# Patient Record
Sex: Male | Born: 1965 | Race: Black or African American | Hispanic: No | Smoking: Current some day smoker
Health system: Southern US, Community
[De-identification: ages and names within clinical notes are randomized; demographics above are authoritative.]

## PROBLEM LIST (undated history)

## (undated) DIAGNOSIS — E8881 Metabolic syndrome: Secondary | ICD-10-CM

## (undated) DIAGNOSIS — R809 Proteinuria, unspecified: Secondary | ICD-10-CM

## (undated) DIAGNOSIS — G510 Bell's palsy: Secondary | ICD-10-CM

## (undated) DIAGNOSIS — Z9989 Dependence on other enabling machines and devices: Secondary | ICD-10-CM

## (undated) DIAGNOSIS — L309 Dermatitis, unspecified: Secondary | ICD-10-CM

## (undated) DIAGNOSIS — E785 Hyperlipidemia, unspecified: Secondary | ICD-10-CM

## (undated) DIAGNOSIS — I10 Essential (primary) hypertension: Secondary | ICD-10-CM

## (undated) DIAGNOSIS — M545 Low back pain, unspecified: Secondary | ICD-10-CM

## (undated) DIAGNOSIS — G4733 Obstructive sleep apnea (adult) (pediatric): Secondary | ICD-10-CM

## (undated) DIAGNOSIS — L739 Follicular disorder, unspecified: Secondary | ICD-10-CM

## (undated) DIAGNOSIS — R7989 Other specified abnormal findings of blood chemistry: Secondary | ICD-10-CM

## (undated) HISTORY — DX: Other specified abnormal findings of blood chemistry: R79.89

## (undated) HISTORY — DX: Follicular disorder, unspecified: L73.9

## (undated) HISTORY — DX: Morbid (severe) obesity due to excess calories: E66.01

## (undated) HISTORY — DX: Proteinuria, unspecified: R80.9

## (undated) HISTORY — DX: Metabolic syndrome: E88.810

## (undated) HISTORY — DX: Metabolic syndrome: E88.81

## (undated) HISTORY — DX: Hyperlipidemia, unspecified: E78.5

## (undated) HISTORY — DX: Bell's palsy: G51.0

## (undated) HISTORY — DX: Obstructive sleep apnea (adult) (pediatric): G47.33

## (undated) HISTORY — DX: Low back pain: M54.5

## (undated) HISTORY — PX: COLONOSCOPY: SHX174

## (undated) HISTORY — DX: Essential (primary) hypertension: I10

## (undated) HISTORY — DX: Dependence on other enabling machines and devices: Z99.89

## (undated) HISTORY — DX: Low back pain, unspecified: M54.50

---

## 2004-09-14 ENCOUNTER — Emergency Department: Payer: Self-pay | Admitting: Emergency Medicine

## 2008-12-14 ENCOUNTER — Ambulatory Visit: Payer: Self-pay | Admitting: Family Medicine

## 2009-01-02 LAB — HM COLONOSCOPY: HM Colonoscopy: NORMAL

## 2009-01-14 ENCOUNTER — Ambulatory Visit: Payer: Self-pay | Admitting: Family Medicine

## 2009-01-21 ENCOUNTER — Ambulatory Visit: Payer: Self-pay | Admitting: Gastroenterology

## 2009-09-06 ENCOUNTER — Ambulatory Visit: Payer: Self-pay | Admitting: Family Medicine

## 2010-10-06 IMAGING — CR DG CHEST 2V
1 series · 3 of 3 positions shown · non-contrast
Comparison: none

REASON FOR EXAM: cough 8550
COMMENTS:

PROCEDURE:     KDR - KDXR CHEST PA (OR AP) AND LAT  - September 06, 2009  [DATE]
RESULT:     The lung fields are clear. The heart, mediastinal and osseous
structures reveal no significant abnormalities.

[Series 1: view not recorded · 0.17mm/px · 3 of 3 slices shown]
[im 1/3]
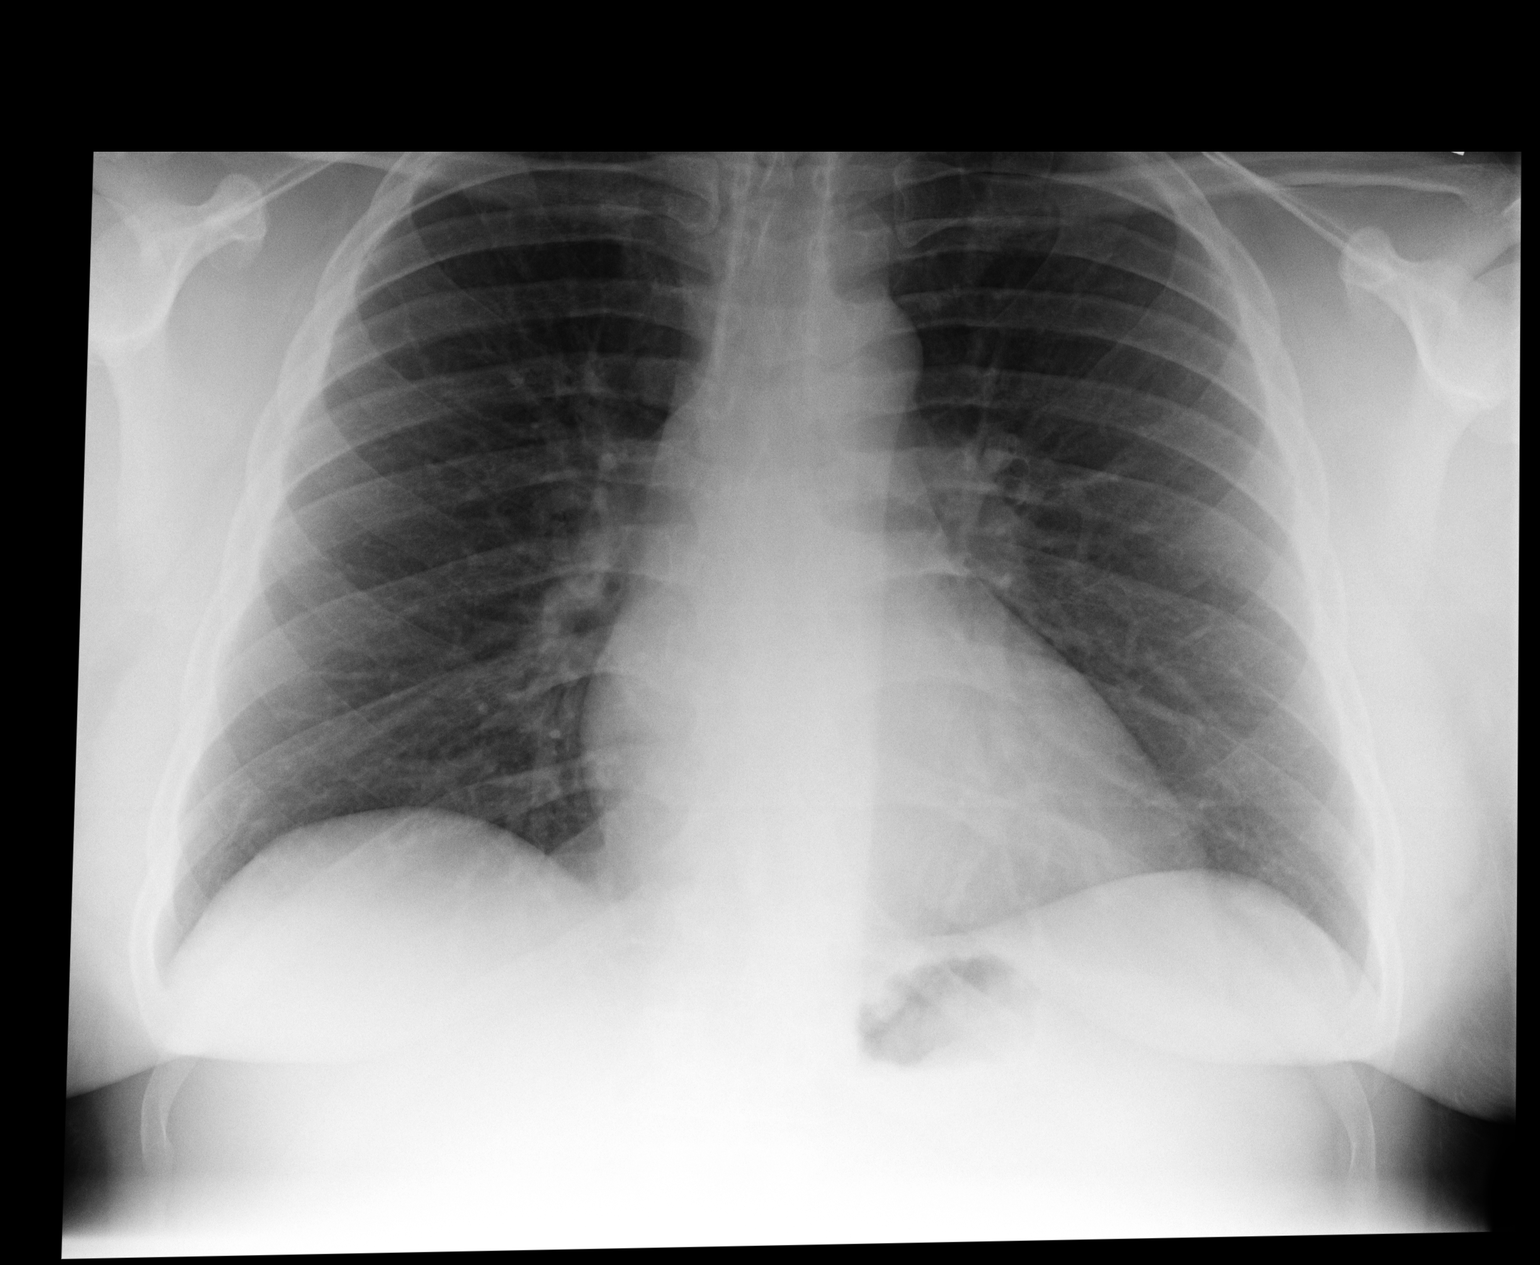
[im 2/3]
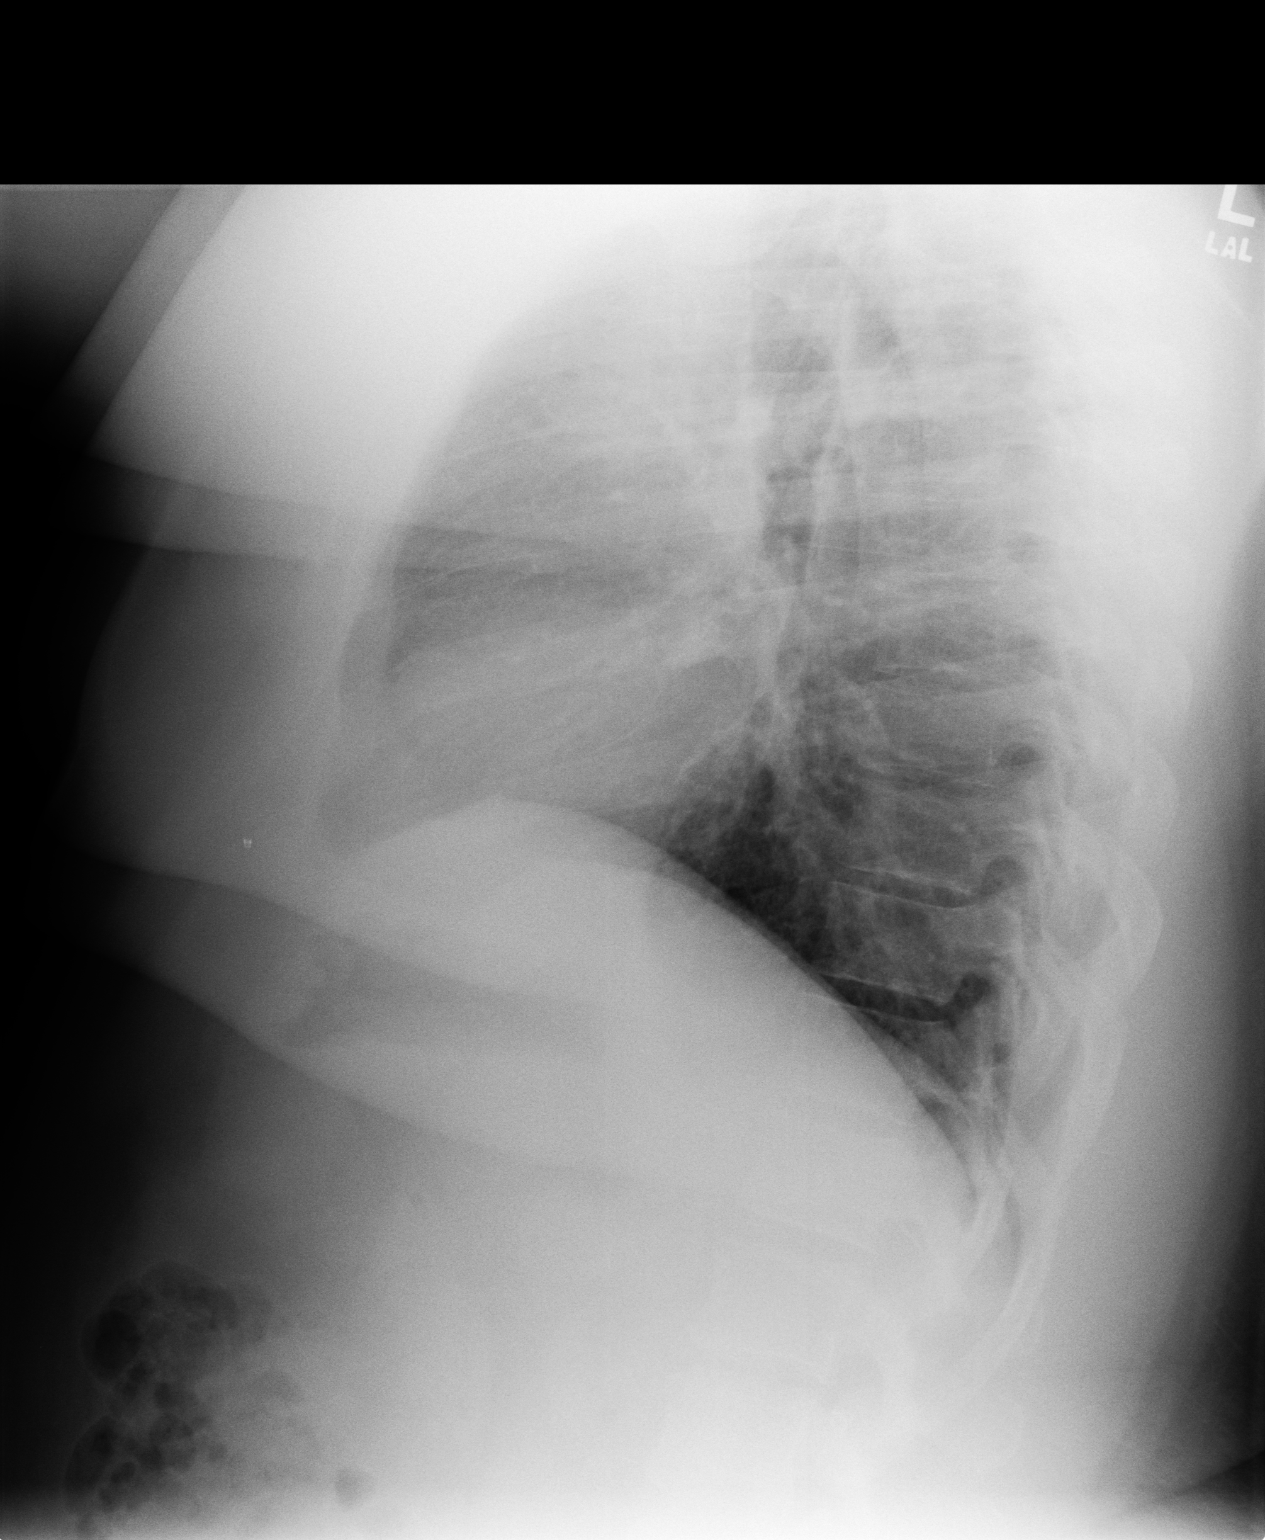
[im 3/3]
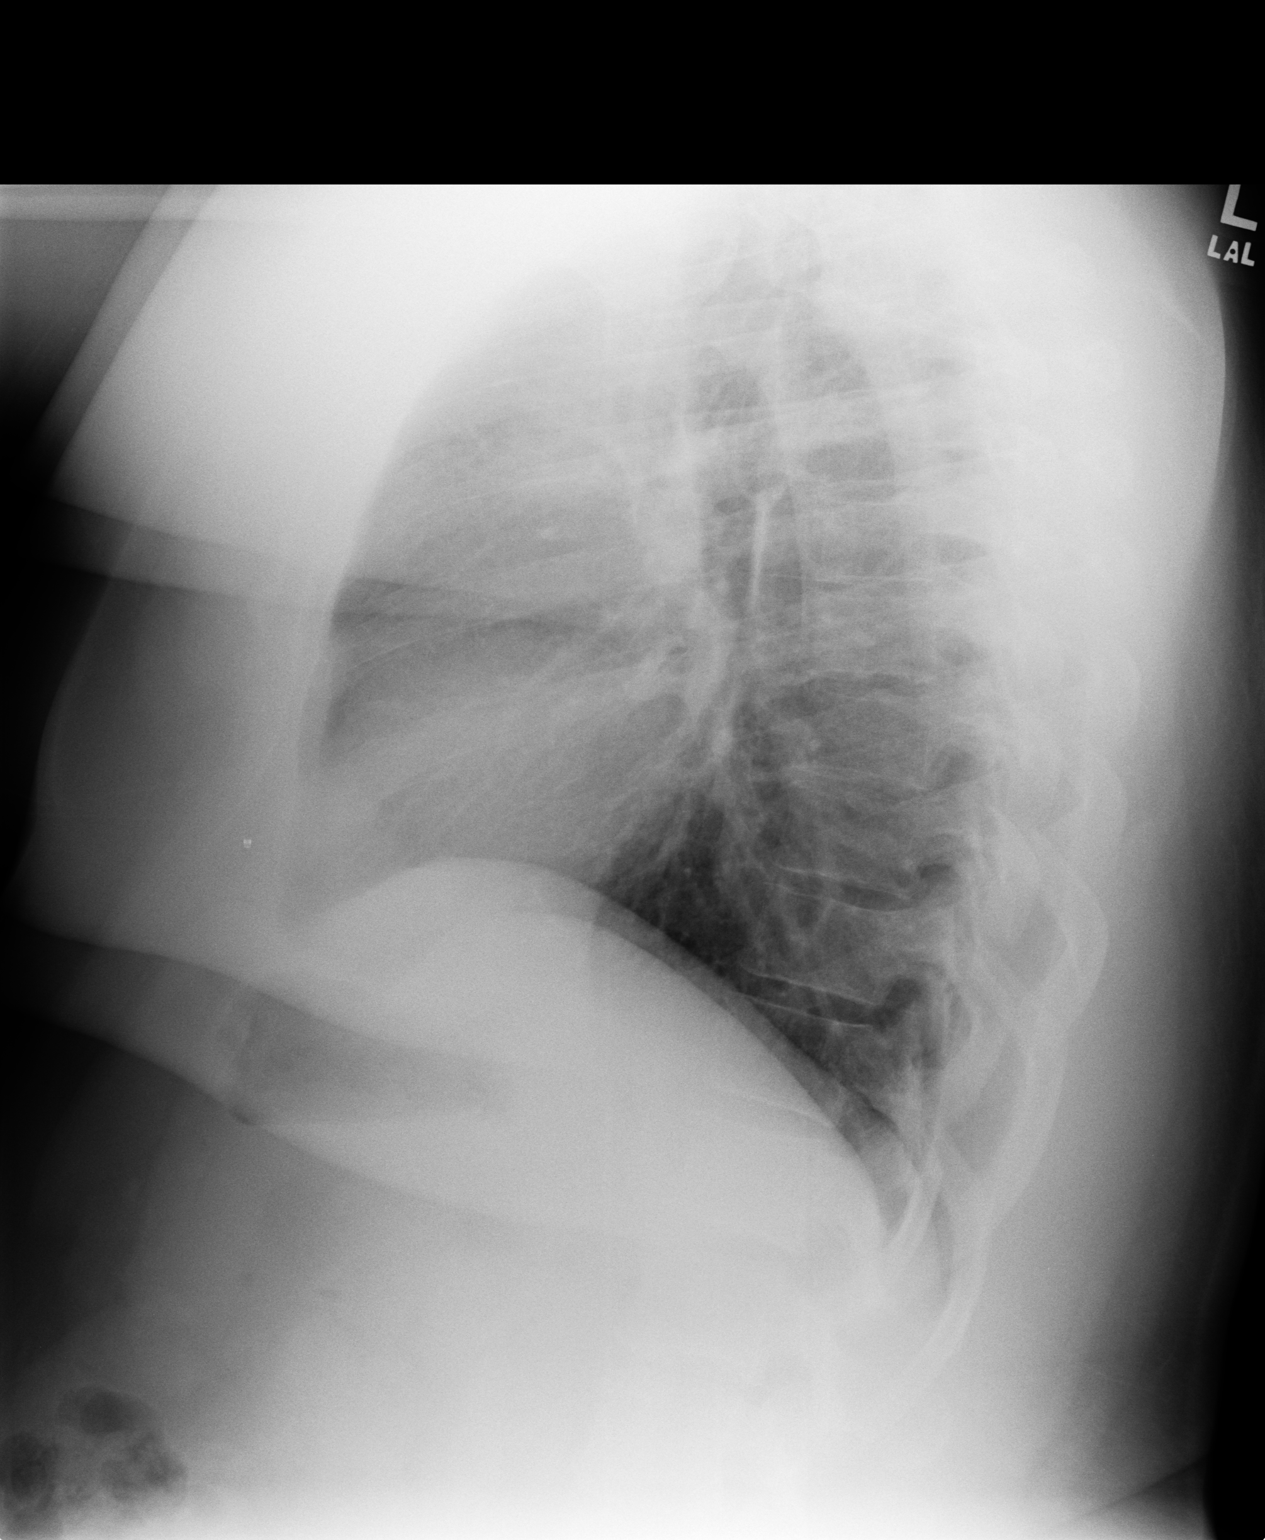

[3 of 3 positions shown; findings below may reference images not displayed]

IMPRESSION: No significant abnormalities are noted.

## 2013-10-02 LAB — LIPID PANEL
CHOLESTEROL: 200 mg/dL (ref 0–200)
HDL: 37 mg/dL (ref 35–70)
LDL Cholesterol: 134 mg/dL
Triglycerides: 143 mg/dL (ref 40–160)

## 2014-03-20 LAB — HEMOGLOBIN A1C: Hgb A1c MFr Bld: 5.7 % (ref 4.0–6.0)

## 2014-07-18 ENCOUNTER — Encounter: Payer: Self-pay | Admitting: Family Medicine

## 2014-07-18 DIAGNOSIS — Z8669 Personal history of other diseases of the nervous system and sense organs: Secondary | ICD-10-CM | POA: Insufficient documentation

## 2014-07-18 DIAGNOSIS — E559 Vitamin D deficiency, unspecified: Secondary | ICD-10-CM | POA: Insufficient documentation

## 2014-07-18 DIAGNOSIS — G4733 Obstructive sleep apnea (adult) (pediatric): Secondary | ICD-10-CM | POA: Insufficient documentation

## 2014-07-18 DIAGNOSIS — M545 Low back pain, unspecified: Secondary | ICD-10-CM | POA: Insufficient documentation

## 2014-07-18 DIAGNOSIS — R809 Proteinuria, unspecified: Secondary | ICD-10-CM | POA: Insufficient documentation

## 2014-07-18 DIAGNOSIS — E668 Other obesity: Secondary | ICD-10-CM | POA: Insufficient documentation

## 2014-07-18 DIAGNOSIS — E785 Hyperlipidemia, unspecified: Secondary | ICD-10-CM | POA: Insufficient documentation

## 2014-07-18 DIAGNOSIS — Z720 Tobacco use: Secondary | ICD-10-CM | POA: Insufficient documentation

## 2014-07-18 DIAGNOSIS — E8881 Metabolic syndrome: Secondary | ICD-10-CM | POA: Insufficient documentation

## 2014-07-18 DIAGNOSIS — I10 Essential (primary) hypertension: Secondary | ICD-10-CM | POA: Insufficient documentation

## 2014-07-23 ENCOUNTER — Ambulatory Visit: Payer: Self-pay | Admitting: Family Medicine

## 2014-10-04 ENCOUNTER — Encounter: Payer: Self-pay | Admitting: Family Medicine

## 2015-04-12 ENCOUNTER — Other Ambulatory Visit: Payer: Self-pay

## 2015-04-12 NOTE — Telephone Encounter (Signed)
Patient requesting refill. 

## 2015-04-13 MED ORDER — HYDROXYZINE HCL 25 MG PO TABS: 25.0000 mg | ORAL_TABLET | Freq: Every evening | ORAL | Status: DC | PRN

## 2015-04-23 ENCOUNTER — Encounter: Payer: Self-pay | Admitting: Family Medicine

## 2015-05-23 ENCOUNTER — Ambulatory Visit (INDEPENDENT_AMBULATORY_CARE_PROVIDER_SITE_OTHER): Payer: PRIVATE HEALTH INSURANCE | Admitting: Family Medicine

## 2015-05-23 ENCOUNTER — Encounter: Payer: Self-pay | Admitting: Family Medicine

## 2015-05-23 VITALS — BP 116/68 | HR 91 | Temp 98.6°F | Resp 16 | Ht 67.0 in | Wt 254.7 lb

## 2015-05-23 DIAGNOSIS — I10 Essential (primary) hypertension: Secondary | ICD-10-CM | POA: Diagnosis not present

## 2015-05-23 DIAGNOSIS — E8881 Metabolic syndrome: Secondary | ICD-10-CM | POA: Diagnosis not present

## 2015-05-23 DIAGNOSIS — G4733 Obstructive sleep apnea (adult) (pediatric): Secondary | ICD-10-CM

## 2015-05-23 DIAGNOSIS — R809 Proteinuria, unspecified: Secondary | ICD-10-CM

## 2015-05-23 DIAGNOSIS — Z125 Encounter for screening for malignant neoplasm of prostate: Secondary | ICD-10-CM

## 2015-05-23 DIAGNOSIS — Z114 Encounter for screening for human immunodeficiency virus [HIV]: Secondary | ICD-10-CM | POA: Diagnosis not present

## 2015-05-23 DIAGNOSIS — L739 Follicular disorder, unspecified: Secondary | ICD-10-CM

## 2015-05-23 DIAGNOSIS — E559 Vitamin D deficiency, unspecified: Secondary | ICD-10-CM | POA: Diagnosis not present

## 2015-05-23 DIAGNOSIS — E785 Hyperlipidemia, unspecified: Secondary | ICD-10-CM | POA: Diagnosis not present

## 2015-05-23 DIAGNOSIS — L200 Besnier's prurigo: Secondary | ICD-10-CM | POA: Diagnosis not present

## 2015-05-23 DIAGNOSIS — L239 Allergic contact dermatitis, unspecified cause: Secondary | ICD-10-CM | POA: Insufficient documentation

## 2015-05-23 DIAGNOSIS — Z0000 Encounter for general adult medical examination without abnormal findings: Secondary | ICD-10-CM | POA: Diagnosis not present

## 2015-05-23 MED ORDER — METFORMIN HCL 500 MG PO TABS: 500.0000 mg | ORAL_TABLET | Freq: Every day | ORAL | Status: DC

## 2015-05-23 MED ORDER — HYDROXYZINE HCL 50 MG PO TABS: 25.0000 mg | ORAL_TABLET | Freq: Every evening | ORAL | Status: DC | PRN

## 2015-05-23 MED ORDER — ATORVASTATIN CALCIUM 40 MG PO TABS: 40.0000 mg | ORAL_TABLET | Freq: Every day | ORAL | Status: DC

## 2015-05-23 MED ORDER — LISINOPRIL 20 MG PO TABS: 20.0000 mg | ORAL_TABLET | Freq: Every day | ORAL | Status: DC

## 2015-05-23 MED ORDER — MINOCYCLINE HCL 100 MG PO TABS: 100.0000 mg | ORAL_TABLET | Freq: Every day | ORAL | Status: DC

## 2015-05-23 MED ORDER — LORATADINE 10 MG PO TABS: 10.0000 mg | ORAL_TABLET | Freq: Every day | ORAL | Status: DC

## 2015-05-23 NOTE — Progress Notes (Signed)
Name: Isaac Delacruz   MRN: [id number]    DOB: 12-06-1965   Date:05/23/2015       Progress Note  Subjective  Chief Complaint  Chief Complaint  Patient presents with  . Annual Exam    HPI  Male Exam: he has nocturia , AUA filler out, mild symptoms. No ED  HTN: taking medication as prescribed, had a gap in insurance but has benefits again. No chest pain or palpitation  Metabolic Syndrome: he denies polydipsia, polyuria or polyphagia, due for labs. Taking Metformin.  Hyperlipidemia: out of medication for the past 2 months, we will recheck labs.   Tobacco use: he is not ready to quit smoking, denies daily cough, no SOB  Allergic Dermatitis: he scratches because he itches, he states it is a habit now, worse at night, not during work hours.    Patient Active Problem List   Diagnosis Date Noted  . Folliculitis 05/23/2015  . Allergic dermatitis 05/23/2015  . History of Bell's palsy 07/18/2014  . Benign essential HTN 07/18/2014  . Dyslipidemia 07/18/2014  . LBP (low back pain) 07/18/2014  . Vitamin D deficiency 07/18/2014  . Dysmetabolic syndrome 07/18/2014  . Microalbuminuria 07/18/2014  . Extreme obesity (HCC) 07/18/2014  . Obstructive apnea 07/18/2014  . Tobacco use 07/18/2014    History reviewed. No pertinent past surgical history.  Family History  Problem Relation Age of Onset  . Cancer Mother 36    Lung  . Cancer Father     Prostate  . Diabetes Father   . Hypertension Brother   . Cancer Maternal Aunt     Lung  . Heart disease Brother     Social History   Social History  . Marital Status: Married    Spouse Name: N/A  . Number of Children: N/A  . Years of Education: N/A   Occupational History  . Not on file.   Social History Main Topics  . Smoking status: Current Every Day Smoker -- 0.75 packs/day for 16 years    Types: Cigarettes  . Smokeless tobacco: Not on file  . Alcohol Use: 0.0 oz/week    0 Standard drinks or equivalent per week     Comment:  rarely  . Drug Use: No  . Sexual Activity: Yes   Other Topics Concern  . Not on file   Social History Narrative     Current outpatient prescriptions:  .  acetaminophen (TYLENOL 8 HOUR) 650 MG CR tablet, Take by mouth., Disp: , Rfl:  .  aspirin 81 MG tablet, Take by mouth., Disp: , Rfl:  .  atorvastatin (LIPITOR) 40 MG tablet, Take 1 tablet (40 mg total) by mouth daily at 6 PM., Disp: 90 tablet, Rfl: 1 .  Cholecalciferol (VITAMIN D HIGH POTENCY) 1000 units capsule, Take by mouth., Disp: , Rfl:  .  hydrOXYzine (ATARAX/VISTARIL) 50 MG tablet, Take 0.5 tablets (25 mg total) by mouth at bedtime as needed for itching., Disp: 90 tablet, Rfl: 1 .  lisinopril (PRINIVIL,ZESTRIL) 20 MG tablet, Take 1 tablet (20 mg total) by mouth daily., Disp: 90 tablet, Rfl: 1 .  metFORMIN (GLUCOPHAGE) 500 MG tablet, Take 1 tablet (500 mg total) by mouth daily with breakfast., Disp: 90 tablet, Rfl: 1 .  loratadine (CLARITIN) 10 MG tablet, Take 1 tablet (10 mg total) by mouth daily., Disp: 90 tablet, Rfl: 1 .  minocycline (DYNACIN) 100 MG tablet, Take 1 tablet (100 mg total) by mouth daily. Reported on 05/23/2015, Disp: 90 tablet, Rfl: 1  No  Known Allergies   ROS  Constitutional: Negative for fever or weight change.  Respiratory: Negative for cough and shortness of breath.   Cardiovascular: Negative for chest pain or palpitations.  Gastrointestinal: Negative for abdominal pain, no bowel changes.  Musculoskeletal: Negative for gait problem or joint swelling.  Skin: Positive  for rash.  Neurological: Negative for dizziness or headache.  No other specific complaints in a complete review of systems (except as listed in HPI above).  Objective  Filed Vitals:   05/23/15 1352  BP: 116/68  Pulse: 91  Temp: 98.6 F (37 C)  TempSrc: Oral  Resp: 16  Height: 5\' 7"  (1.702 m)  Weight: 254 lb 11.2 oz (115.531 kg)  SpO2: 97%    Body mass index is 39.88 kg/(m^2).  Physical Exam  Constitutional: Patient  appears well-developed and well-nourished, obese  No distress.  HENT: Head: Normocephalic and atraumatic. Ears: B TMs ok, no erythema or effusion; Nose: Nose normal. Mouth/Throat: Oropharynx is clear and moist. No oropharyngeal exudate.  Eyes: Conjunctivae and EOM are normal. Pupils are equal, round, and reactive to light. No scleral icterus.  Neck: Normal range of motion. Neck supple. No JVD present. No thyromegaly present.  Cardiovascular: Normal rate, regular rhythm and normal heart sounds.  No murmur heard. No BLE edema. Pulmonary/Chest: Effort normal and breath sounds normal. No respiratory distress. Abdominal: Soft. Bowel sounds are normal, no distension. There is no tenderness. no masses MALE GENITALIA: Normal descended testes bilaterally, no masses palpated, no hernias, no lesions, no discharge RECTAL: Prostate exam not done Musculoskeletal: Normal range of motion, no joint effusions. No gross deformities Neurological: he is alert and oriented to person, place, and time. No cranial nerve deficit. Coordination, balance, strength, speech and gait are normal.  Skin: Skin is warm and dry.  Acanthosis nigricans on neck, also has some folliculitis on neck- beard area has improved, also has excoriations on arms and upper back and legs from scratching - seen by Dermatologist in the past and advised to stop scratching.  Psychiatric: Patient has a normal mood and affect. behavior is normal. Judgment and thought content normal.  PHQ2/9: Depression screen PHQ 2/9 05/23/2015  Decreased Interest 0  Down, Depressed, Hopeless 0  PHQ - 2 Score 0    Fall Risk: Fall Risk  05/23/2015  Falls in the past year? No      Functional Status Survey: Is the patient deaf or have difficulty hearing?: No Does the patient have difficulty seeing, even when wearing glasses/contacts?: No Does the patient have difficulty concentrating, remembering, or making decisions?: No Does the patient have difficulty walking  or climbing stairs?: No Does the patient have difficulty dressing or bathing?: No Does the patient have difficulty doing errands alone such as visiting a doctor's office or shopping?: No  IPSS Questionnaire (AUA-7): Over the past month.   1)  How often have you had a sensation of not emptying your bladder completely after you finish urinating?  1 - Less than 1 time in 5  2)  How often have you had to urinate again less than two hours after you finished urinating? 0 - Not at all  3)  How often have you found you stopped and started again several times when you urinated?  1 - Less than 1 time in 5  4) How difficult have you found it to postpone urination?  0 - Not at all  5) How often have you had a weak urinary stream?  0 - Not at all  6) How often have you had to push or strain to begin urination?  0 - Not at all  7) How many times did you most typically get up to urinate from the time you went to bed until the time you got up in the morning?  2 - 2 times  Total score:  0-7 mildly symptomatic   8-19 moderately symptomatic   20-35 severely symptomatic      Assessment & Plan  1. Encounter for routine history and physical exam for male  Discussed importance of 150 minutes of physical activity weekly, eat two servings of fish weekly, eat one serving of tree nuts ( cashews, pistachios, pecans, almonds.Marland Kitchen) every other day, eat 6 servings of fruit/vegetables daily and drink plenty of water and avoid sweet beverages.    2. Benign essential HTN  - lisinopril (PRINIVIL,ZESTRIL) 20 MG tablet; Take 1 tablet (20 mg total) by mouth daily.  Dispense: 90 tablet; Refill: 1 - Comprehensive metabolic panel - CBC with Differential/Platelet  3. Dyslipidemia  - atorvastatin (LIPITOR) 40 MG tablet; Take 1 tablet (40 mg total) by mouth daily at 6 PM.  Dispense: 90 tablet; Refill: 1 - Lipid panel  4. Obstructive apnea  Continue CPAP every night  5. Morbid obesity, unspecified obesity type  [facility])  Discussed with the patient the risk posed by an increased BMI. Discussed importance of portion control, calorie counting and at least 150 minutes of physical activity weekly. Avoid sweet beverages and drink more water. Eat at least 6 servings of fruit and vegetables daily   6. Dysmetabolic syndrome  - metFORMIN (GLUCOPHAGE) 500 MG tablet; Take 1 tablet (500 mg total) by mouth daily with breakfast.  Dispense: 90 tablet; Refill: 1 - Hemoglobin A1c  7. Microalbuminuria  - POCT UA - Microalbumin  8. Vitamin D deficiency  - VITAMIN D 25 Hydroxy (Vit-D Deficiency, Fractures)  9. Folliculitis  - minocycline (DYNACIN) 100 MG tablet; Take 1 tablet (100 mg total) by mouth daily. Reported on 05/23/2015  Dispense: 90 tablet; Refill: 1  10. Allergic dermatitis  - hydrOXYzine (ATARAX/VISTARIL) 50 MG tablet; Take 0.5 tablets (25 mg total) by mouth at bedtime as needed for itching.  Dispense: 90 tablet; Refill: 1 - loratadine (CLARITIN) 10 MG tablet; Take 1 tablet (10 mg total) by mouth daily.  Dispense: 90 tablet; Refill: 1  11. Prostate cancer screening  - PSA  12. Encounter for screening for HIV  - HIV antibody

## 2015-05-24 LAB — CBC WITH DIFFERENTIAL/PLATELET
BASOS: 1 %
Basophils Absolute: 0.1 10*3/uL (ref 0.0–0.2)
EOS (ABSOLUTE): 0.3 10*3/uL (ref 0.0–0.4)
EOS: 6 %
HEMATOCRIT: 49.4 % (ref 37.5–51.0)
Hemoglobin: 16.5 g/dL (ref 12.6–17.7)
Immature Grans (Abs): 0 10*3/uL (ref 0.0–0.1)
Immature Granulocytes: 0 %
LYMPHS ABS: 1.9 10*3/uL (ref 0.7–3.1)
Lymphs: 34 %
MCH: 29.3 pg (ref 26.6–33.0)
MCHC: 33.4 g/dL (ref 31.5–35.7)
MCV: 88 fL (ref 79–97)
Monocytes Absolute: 1 10*3/uL — ABNORMAL HIGH (ref 0.1–0.9)
Monocytes: 17 %
Neutrophils Absolute: 2.4 10*3/uL (ref 1.4–7.0)
Neutrophils: 42 %
PLATELETS: 155 10*3/uL (ref 150–379)
RBC: 5.64 x10E6/uL (ref 4.14–5.80)
RDW: 14.9 % (ref 12.3–15.4)
WBC: 5.7 10*3/uL (ref 3.4–10.8)

## 2015-05-24 LAB — COMPREHENSIVE METABOLIC PANEL
A/G RATIO: 1.2 (ref 1.2–2.2)
ALT: 17 IU/L (ref 0–44)
AST: 23 IU/L (ref 0–40)
Albumin: 4.2 g/dL (ref 3.5–5.5)
Alkaline Phosphatase: 67 IU/L (ref 39–117)
BUN/Creatinine Ratio: 11 (ref 9–20)
BUN: 13 mg/dL (ref 6–24)
Bilirubin Total: 0.6 mg/dL (ref 0.0–1.2)
CALCIUM: 9.6 mg/dL (ref 8.7–10.2)
CHLORIDE: 101 mmol/L (ref 96–106)
CO2: 24 mmol/L (ref 18–29)
Creatinine, Ser: 1.15 mg/dL (ref 0.76–1.27)
GFR, EST AFRICAN AMERICAN: 87 mL/min/{1.73_m2} (ref >59)
GFR, EST NON AFRICAN AMERICAN: 75 mL/min/{1.73_m2} (ref >59)
Globulin, Total: 3.4 g/dL (ref 1.5–4.5)
Glucose: 92 mg/dL (ref 65–99)
POTASSIUM: 4.3 mmol/L (ref 3.5–5.2)
Sodium: 140 mmol/L (ref 134–144)
TOTAL PROTEIN: 7.6 g/dL (ref 6.0–8.5)

## 2015-05-24 LAB — HIV ANTIBODY (ROUTINE TESTING W REFLEX): HIV SCREEN 4TH GENERATION: NONREACTIVE

## 2015-05-24 LAB — PSA: Prostate Specific Ag, Serum: 0.5 ng/mL (ref 0.0–4.0)

## 2015-05-24 LAB — LIPID PANEL
CHOLESTEROL TOTAL: 194 mg/dL (ref 100–199)
Chol/HDL Ratio: 6.3 ratio units — ABNORMAL HIGH (ref 0.0–5.0)
HDL: 31 mg/dL — AB (ref >39)
LDL Calculated: 135 mg/dL — ABNORMAL HIGH (ref 0–99)
TRIGLYCERIDES: 140 mg/dL (ref 0–149)
VLDL Cholesterol Cal: 28 mg/dL (ref 5–40)

## 2015-05-24 LAB — HEMOGLOBIN A1C
ESTIMATED AVERAGE GLUCOSE: 123 mg/dL
Hgb A1c MFr Bld: 5.9 % — ABNORMAL HIGH (ref 4.8–5.6)

## 2015-05-24 LAB — VITAMIN D 25 HYDROXY (VIT D DEFICIENCY, FRACTURES): VIT D 25 HYDROXY: 10.1 ng/mL — AB (ref 30.0–100.0)

## 2015-05-26 ENCOUNTER — Other Ambulatory Visit: Payer: Self-pay | Admitting: Family Medicine

## 2015-05-26 MED ORDER — VITAMIN D (ERGOCALCIFEROL) 1.25 MG (50000 UNIT) PO CAPS: 50000.0000 [IU] | ORAL_CAPSULE | ORAL | Status: DC

## 2015-11-22 ENCOUNTER — Ambulatory Visit (INDEPENDENT_AMBULATORY_CARE_PROVIDER_SITE_OTHER): Payer: BLUE CROSS/BLUE SHIELD | Admitting: Family Medicine

## 2015-11-22 ENCOUNTER — Encounter: Payer: Self-pay | Admitting: Family Medicine

## 2015-11-22 VITALS — BP 122/64 | HR 73 | Temp 97.8°F | Resp 16 | Ht 67.0 in | Wt 253.4 lb

## 2015-11-22 DIAGNOSIS — E8881 Metabolic syndrome: Secondary | ICD-10-CM

## 2015-11-22 DIAGNOSIS — L239 Allergic contact dermatitis, unspecified cause: Secondary | ICD-10-CM | POA: Diagnosis not present

## 2015-11-22 DIAGNOSIS — I10 Essential (primary) hypertension: Secondary | ICD-10-CM

## 2015-11-22 DIAGNOSIS — L739 Follicular disorder, unspecified: Secondary | ICD-10-CM | POA: Diagnosis not present

## 2015-11-22 DIAGNOSIS — R809 Proteinuria, unspecified: Secondary | ICD-10-CM

## 2015-11-22 DIAGNOSIS — Z23 Encounter for immunization: Secondary | ICD-10-CM | POA: Diagnosis not present

## 2015-11-22 DIAGNOSIS — E785 Hyperlipidemia, unspecified: Secondary | ICD-10-CM | POA: Diagnosis not present

## 2015-11-22 DIAGNOSIS — G4733 Obstructive sleep apnea (adult) (pediatric): Secondary | ICD-10-CM

## 2015-11-22 LAB — POCT UA - MICROALBUMIN: MICROALBUMIN (UR) POC: 20 mg/L

## 2015-11-22 MED ORDER — MINOCYCLINE HCL 100 MG PO TABS: 100.0000 mg | ORAL_TABLET | Freq: Every day | ORAL | 1 refills | Status: DC

## 2015-11-22 MED ORDER — ATORVASTATIN CALCIUM 40 MG PO TABS: 40.0000 mg | ORAL_TABLET | Freq: Every day | ORAL | 1 refills | Status: DC

## 2015-11-22 MED ORDER — METFORMIN HCL 500 MG PO TABS: 500.0000 mg | ORAL_TABLET | Freq: Every day | ORAL | 1 refills | Status: DC

## 2015-11-22 MED ORDER — LISINOPRIL 20 MG PO TABS: 20.0000 mg | ORAL_TABLET | Freq: Every day | ORAL | 1 refills | Status: DC

## 2015-11-22 MED ORDER — HYDROXYZINE HCL 50 MG PO TABS: 50.0000 mg | ORAL_TABLET | Freq: Every evening | ORAL | 1 refills | Status: DC | PRN

## 2015-11-22 NOTE — Progress Notes (Signed)
Name: Isaac Delacruz   MRN: [id number]    DOB: 1965/05/20   Date:11/22/2015       Progress Note  Subjective  Chief Complaint  Chief Complaint  Patient presents with  . Flu Vaccine  . Hyperlipidemia    6 month follow up  . Hypertension  . Obesity  . Sleep Apnea    HPI  HTN: taking medication as prescribed. No chest pain, SOB or palpitation. He checks bp at home and it is usually 120's-140's/60's-70's  Metabolic Syndrome: he denies polydipsia, polyuria or polyphagia, last hgbA1C was 5.9%. Taking Metformin and denies side effects.  Hyperlipidemia: he was out of medication 2 months prior to last labs. Discussed ways to increase good cholesterol  ( it was 31)  Tobacco use: he is not ready to quit smoking, denies daily cough, no SOB, there is a family history of lung cancer, and reminded him the importance of quitting smoking.   Allergic Dermatitis: he scratches because he itches, he states it is a habit now, worse at night, not during work hours. Doing well with Atarax at night  OSA: he wears CPAP every night, very seldom wakes up with a headache, he denies day time fatigue  Obesity: weight has been stable, he has noticed decreased libido, no joint pains. Explained that losing weight can help with that, also discussed risk of testosterone supplementation.     Patient Active Problem List   Diagnosis Date Noted  . Folliculitis 05/23/2015  . Allergic dermatitis 05/23/2015  . History of Bell's palsy 07/18/2014  . Benign essential HTN 07/18/2014  . Dyslipidemia 07/18/2014  . LBP (low back pain) 07/18/2014  . Vitamin D deficiency 07/18/2014  . Dysmetabolic syndrome 07/18/2014  . Microalbuminuria 07/18/2014  . Extreme obesity (HCC) 07/18/2014  . Obstructive apnea 07/18/2014  . Tobacco use 07/18/2014    History reviewed. No pertinent surgical history.  Family History  Problem Relation Age of Onset  . Cancer Mother 44    Lung  . Cancer Father     Prostate  . Diabetes  Father   . Hypertension Brother   . Cancer Maternal Aunt     Lung  . Heart disease Brother     Social History   Social History  . Marital status: Married    Spouse name: N/A  . Number of children: N/A  . Years of education: N/A   Occupational History  . Not on file.   Social History Main Topics  . Smoking status: Current Every Day Smoker    Packs/day: 0.75    Years: 18.00    Types: Cigarettes  . Smokeless tobacco: Never Used  . Alcohol use 0.0 oz/week     Comment: rarely  . Drug use: No  . Sexual activity: Yes   Other Topics Concern  . Not on file   Social History Narrative  . No narrative on file     Current Outpatient Prescriptions:  .  acetaminophen (TYLENOL 8 HOUR) 650 MG CR tablet, Take by mouth., Disp: , Rfl:  .  aspirin 81 MG tablet, Take by mouth., Disp: , Rfl:  .  atorvastatin (LIPITOR) 40 MG tablet, Take 1 tablet (40 mg total) by mouth daily at 6 PM., Disp: 90 tablet, Rfl: 1 .  Cholecalciferol (VITAMIN D HIGH POTENCY) 1000 units capsule, Take by mouth., Disp: , Rfl:  .  hydrOXYzine (ATARAX/VISTARIL) 50 MG tablet, Take 1 tablet (50 mg total) by mouth at bedtime as needed for itching., Disp: 90 tablet, Rfl: 1 .  lisinopril (PRINIVIL,ZESTRIL) 20 MG tablet, Take 1 tablet (20 mg total) by mouth daily., Disp: 90 tablet, Rfl: 1 .  loratadine (CLARITIN) 10 MG tablet, Take 1 tablet (10 mg total) by mouth daily., Disp: 90 tablet, Rfl: 1 .  metFORMIN (GLUCOPHAGE) 500 MG tablet, Take 1 tablet (500 mg total) by mouth daily with breakfast., Disp: 90 tablet, Rfl: 1 .  minocycline (DYNACIN) 100 MG tablet, Take 1 tablet (100 mg total) by mouth daily. Reported on 05/23/2015, Disp: 90 tablet, Rfl: 1 .  Vitamin D, Ergocalciferol, (DRISDOL) 50000 units CAPS capsule, Take 1 capsule (50,000 Units total) by mouth every 7 (seven) days., Disp: 12 capsule, Rfl: 0  No Known Allergies   ROS  Constitutional: Negative for fever or weight change.  Respiratory: Negative for cough and  shortness of breath.   Cardiovascular: Negative for chest pain or palpitations.  Gastrointestinal: Negative for abdominal pain, no bowel changes.  Musculoskeletal: Negative for gait problem or joint swelling.  Skin: Positive for rash - folliculitis - doing well on medication .  Neurological: Negative for dizziness or headache.  No other specific complaints in a complete review of systems (except as listed in HPI above).  Objective  Vitals:   11/22/15 0844  BP: 122/64  Pulse: 73  Resp: 16  Temp: 97.8 F (36.6 C)  TempSrc: Oral  SpO2: 98%  Weight: 253 lb 7 oz (115 kg)  Height: 5\' 7"  (1.702 m)    Body mass index is 39.69 kg/m.  Physical Exam  Constitutional: Patient appears well-developed and well-nourished. Obese No distress.  HEENT: head atraumatic, normocephalic, pupils equal and reactive to light,  neck supple, throat within normal limits Cardiovascular: Normal rate, regular rhythm and normal heart sounds.  No murmur heard. No BLE edema. Pulmonary/Chest: Effort normal and breath sounds normal. No respiratory distress. Abdominal: Soft.  There is no tenderness. Skin: neck resolved, some irritation/ from picking on hair follicle on beard area, but greatly improved  Psychiatric: Patient has a normal mood and affect. behavior is normal. Judgment and thought content normal.  PHQ2/9: Depression screen [facility] 2/9 11/22/2015 05/23/2015  Decreased Interest 0 0  Down, Depressed, Hopeless 0 0  PHQ - 2 Score 0 0     Fall Risk: Fall Risk  11/22/2015 05/23/2015  Falls in the past year? No No      Functional Status Survey: Is the patient deaf or have difficulty hearing?: No Does the patient have difficulty seeing, even when wearing glasses/contacts?: No Does the patient have difficulty concentrating, remembering, or making decisions?: No Does the patient have difficulty walking or climbing stairs?: No Does the patient have difficulty dressing or bathing?: No Does the patient have  difficulty doing errands alone such as visiting a doctor's office or shopping?: No    Assessment & Plan  1. Benign essential HTN  - lisinopril (PRINIVIL,ZESTRIL) 20 MG tablet; Take 1 tablet (20 mg total) by mouth daily.  Dispense: 90 tablet; Refill: 1 -EKG  2. Need for influenza vaccination  - Flu Vaccine QUAD 36+ mos PF IM (Fluarix & Fluzone Quad PF)  3. Dyslipidemia  - atorvastatin (LIPITOR) 40 MG tablet; Take 1 tablet (40 mg total) by mouth daily at 6 PM.  Dispense: 90 tablet; Refill: 1  4. Dysmetabolic syndrome  - metFORMIN (GLUCOPHAGE) 500 MG tablet; Take 1 tablet (500 mg total) by mouth daily with breakfast.  Dispense: 90 tablet; Refill: 1  5. Obstructive apnea  Continue CPAP every night -EKG  6. Morbid obesity, unspecified obesity type (  HCC)  Discussed with the patient the risk posed by an increased BMI. Discussed importance of portion control, calorie counting and at least 150 minutes of physical activity weekly. Avoid sweet beverages and drink more water. Eat at least 6 servings of fruit and vegetables daily   7. Microalbuminuria  -urine micro   8. Folliculitis  - minocycline (DYNACIN) 100 MG tablet; Take 1 tablet (100 mg total) by mouth daily. Reported on 05/23/2015  Dispense: 90 tablet; Refill: 1  9. Allergic dermatitis  - hydrOXYzine (ATARAX/VISTARIL) 50 MG tablet; Take 1 tablet (50 mg total) by mouth at bedtime as needed for itching.  Dispense: 90 tablet; Refill: 1

## 2016-05-25 ENCOUNTER — Encounter: Payer: BLUE CROSS/BLUE SHIELD | Admitting: Family Medicine

## 2016-05-27 ENCOUNTER — Other Ambulatory Visit: Payer: Self-pay | Admitting: Family Medicine

## 2016-05-27 DIAGNOSIS — L239 Allergic contact dermatitis, unspecified cause: Secondary | ICD-10-CM

## 2016-05-27 NOTE — Telephone Encounter (Signed)
Patient requesting refill of Loratadine to [facility].

## 2016-06-12 ENCOUNTER — Encounter: Payer: Self-pay | Admitting: Family Medicine

## 2016-06-12 ENCOUNTER — Ambulatory Visit (INDEPENDENT_AMBULATORY_CARE_PROVIDER_SITE_OTHER): Payer: BLUE CROSS/BLUE SHIELD | Admitting: Family Medicine

## 2016-06-12 VITALS — BP 122/62 | HR 73 | Temp 98.0°F | Resp 16 | Ht 67.0 in | Wt 245.2 lb

## 2016-06-12 DIAGNOSIS — J302 Other seasonal allergic rhinitis: Secondary | ICD-10-CM

## 2016-06-12 DIAGNOSIS — H6983 Other specified disorders of Eustachian tube, bilateral: Secondary | ICD-10-CM

## 2016-06-12 DIAGNOSIS — H6993 Unspecified Eustachian tube disorder, bilateral: Secondary | ICD-10-CM

## 2016-06-12 MED ORDER — FLUTICASONE PROPIONATE 50 MCG/ACT NA SUSP: 2.0000 | Freq: Every day | NASAL | 1 refills | Status: DC

## 2016-06-12 NOTE — Progress Notes (Signed)
Name: Isaac Delacruz   MRN: [id number]    DOB: [birth year]   Date:06/12/2016       Progress Note  Subjective  Chief Complaint  Chief Complaint  Patient presents with  . Otalgia    rt ear for 3 days    HPI  Pt presents with 3-4 day history of right ear throbbing pain at 6//10 pain, he also notes ongoing allergic rhinitis during the spring season. He denies sinus pain, CP, shortness of breath, or sore throat.  No NVD. Pt is current every day smoker - smokes ~5 cigarettes a day.  Patient Active Problem List   Diagnosis Date Noted  . Folliculitis 05/23/2015  . Allergic dermatitis 05/23/2015  . History of Bell's palsy 07/18/2014  . Benign essential HTN 07/18/2014  . Dyslipidemia 07/18/2014  . LBP (low back pain) 07/18/2014  . Vitamin D deficiency 07/18/2014  . Dysmetabolic syndrome 07/18/2014  . Microalbuminuria 07/18/2014  . Extreme obesity 07/18/2014  . Obstructive apnea 07/18/2014  . Tobacco use 07/18/2014    Social History  Substance Use Topics  . Smoking status: Current Every Day Smoker    Packs/day: 0.75    Years: 18.00    Types: Cigarettes  . Smokeless tobacco: Never Used  . Alcohol use 0.0 oz/week     Comment: rarely     Current Outpatient Prescriptions:  .  acetaminophen (TYLENOL 8 HOUR) 650 MG CR tablet, Take by mouth., Disp: , Rfl:  .  aspirin 81 MG tablet, Take by mouth., Disp: , Rfl:  .  atorvastatin (LIPITOR) 40 MG tablet, Take 1 tablet (40 mg total) by mouth daily at 6 PM., Disp: 90 tablet, Rfl: 1 .  Cholecalciferol (VITAMIN D HIGH POTENCY) 1000 units capsule, Take by mouth., Disp: , Rfl:  .  fluticasone (FLONASE) 50 MCG/ACT nasal spray, Place 2 sprays into both nostrils daily., Disp: 16 g, Rfl: 1 .  hydrOXYzine (ATARAX/VISTARIL) 50 MG tablet, Take 1 tablet (50 mg total) by mouth at bedtime as needed for itching., Disp: 90 tablet, Rfl: 1 .  lisinopril (PRINIVIL,ZESTRIL) 20 MG tablet, Take 1 tablet (20 mg total) by mouth daily., Disp: 90 tablet, Rfl: 1 .   loratadine (CLARITIN) 10 MG tablet, TAKE ONE TABLET BY MOUTH EVERY DAY, Disp: 90 tablet, Rfl: 1 .  metFORMIN (GLUCOPHAGE) 500 MG tablet, Take 1 tablet (500 mg total) by mouth daily with breakfast., Disp: 90 tablet, Rfl: 1 .  minocycline (DYNACIN) 100 MG tablet, Take 1 tablet (100 mg total) by mouth daily. Reported on 05/23/2015, Disp: 90 tablet, Rfl: 1 .  Vitamin D, Ergocalciferol, (DRISDOL) 50000 units CAPS capsule, Take 1 capsule (50,000 Units total) by mouth every 7 (seven) days., Disp: 12 capsule, Rfl: 0  No Known Allergies  ROS  Constitutional: Negative for fever or weight change.  HEENT: See HPI Respiratory: Negative for cough and shortness of breath.   Cardiovascular: Negative for chest pain or palpitations.  Gastrointestinal: Negative for abdominal pain, no bowel changes.  Musculoskeletal: Negative for gait problem or joint swelling.  Skin: Negative for rash.  Neurological: Negative for dizziness or headache.  No other specific complaints in a complete review of systems (except as listed in HPI above).  Objective  Vitals:   06/12/16 1431 06/12/16 1518  BP: 122/62   Pulse: (!) 109 73  Resp: 16   Temp: 98 F (36.7 C)   SpO2: 98%   Weight: 245 lb 3 oz (111.2 kg)   Height: 5\' 7"  (1.702 m)  Body mass index is 38.4 kg/m.  Nursing Note and Vital Signs reviewed.  Physical Exam  Constitutional: Patient appears well-developed and well-nourished. Obese No distress.  HEENT: head atraumatic, normocephalic, pupils equal and reactive to light, conjunctiva with mild redness bilaterally, EOM's intact, Right TM retracted, left TM with mild bulging, no maxillary or frontal sinus pain on palpation, neck supple without lymphadenopathy, oropharynx pink and moist without exudate. Nares normal, turbinates hypertrophied bilaterally Cardiovascular: Normal rate, regular rhythm, S1/S2 present.  No murmur or rub heard. No BLE edema. Pulmonary/Chest: Effort normal and breath sounds clear. No  respiratory distress or retractions. Psychiatric: Patient has a normal mood and affect. behavior is normal. Judgment and thought content normal.  No results found for this or any previous visit (from the past 2160 hour(s)).  Assessment & Plan  1. Seasonal allergic rhinitis, unspecified trigger -Will purchase OTC antihistamine per AVS.  - fluticasone (FLONASE) 50 MCG/ACT nasal spray; Place 2 sprays into both nostrils daily.  Dispense: 16 g; Refill: 1  2. Eustachian tube dysfunction, bilateral  - fluticasone (FLONASE) 50 MCG/ACT nasal spray; Place 2 sprays into both nostrils daily.  Dispense: 16 g; Refill: 1 -RTC in 2-3 weeks if not improving, sooner if red flags occur as discussed below.   -Red flags and when to present for emergency care or RTC including fever >101.56F, chest pain, shortness of breath, new/worsening/un-resolving symptoms or pain reviewed with patient at time of visit. Follow up and care instructions discussed and provided in AVS. -Reviewed Health Maintenance: Has physical scheduled for August.  I saw the patient with Maurice SmallEmily Boyce, FNP, NP-C.   Alba CoryKrichna Sowles, MD [facility] 06/14/2016, 9:43 PM

## 2016-06-12 NOTE — Patient Instructions (Addendum)
Please buy either Cetirizine (Zyrtec), Xyzal, Loratidine (Claritin), or Fexofenadine (Allegra) and take daily. Allergies An allergy is when your body reacts to a substance in a way that is not normal. An allergic reaction can happen after you:  Eat something.  Breathe in something.  Touch something. You can be allergic to:  Things that are only around during certain seasons, like molds and pollens.  Foods.  Drugs.  Insects.  Animal dander. What are the signs or symptoms?  Puffiness (swelling). This may happen on the lips, face, tongue, mouth, or throat.  Sneezing.  Coughing.  Breathing loudly (wheezing).  Stuffy nose.  Tingling in the mouth.  A rash.  Itching.  Itchy, red, puffy areas of skin (hives).  Watery eyes.  Throwing up (vomiting).  Watery poop (diarrhea).  Dizziness.  Feeling faint or fainting.  Trouble breathing or swallowing.  A tight feeling in the chest.  A fast heartbeat. How is this diagnosed? Allergies can be diagnosed with:  A medical and family history.  Skin tests.  Blood tests.  A food diary. A food diary is a record of all the foods, drinks, and symptoms you have each day.  The results of an elimination diet. This diet involves making sure not to eat certain foods and then seeing what happens when you start eating them again. How is this treated? There is no cure for allergies, but allergic reactions can be treated with medicine. Severe reactions usually need to be treated at a hospital. How is this prevented? The best way to prevent an allergic reaction is to avoid the thing you are allergic to. Allergy shots and medicines can also help prevent reactions in some cases. This information is not intended to replace advice given to you by your health care provider. Make sure you discuss any questions you have with your health care provider. Document Released: 05/16/2012 Document Revised: 09/16/2015 Document Reviewed:  10/31/2013 Elsevier Interactive Patient Education  2017 [employer]

## 2016-08-18 ENCOUNTER — Other Ambulatory Visit: Payer: Self-pay | Admitting: Family Medicine

## 2016-08-18 DIAGNOSIS — H6983 Other specified disorders of Eustachian tube, bilateral: Secondary | ICD-10-CM

## 2016-08-18 DIAGNOSIS — J302 Other seasonal allergic rhinitis: Secondary | ICD-10-CM | POA: Insufficient documentation

## 2016-09-09 ENCOUNTER — Ambulatory Visit (INDEPENDENT_AMBULATORY_CARE_PROVIDER_SITE_OTHER): Payer: BLUE CROSS/BLUE SHIELD | Admitting: Family Medicine

## 2016-09-09 ENCOUNTER — Encounter: Payer: Self-pay | Admitting: Family Medicine

## 2016-09-09 VITALS — BP 120/68 | HR 98 | Temp 98.5°F | Resp 16 | Ht 67.8 in | Wt 230.6 lb

## 2016-09-09 DIAGNOSIS — G4733 Obstructive sleep apnea (adult) (pediatric): Secondary | ICD-10-CM

## 2016-09-09 DIAGNOSIS — I10 Essential (primary) hypertension: Secondary | ICD-10-CM

## 2016-09-09 DIAGNOSIS — E8881 Metabolic syndrome: Secondary | ICD-10-CM

## 2016-09-09 DIAGNOSIS — E559 Vitamin D deficiency, unspecified: Secondary | ICD-10-CM | POA: Diagnosis not present

## 2016-09-09 DIAGNOSIS — Z1211 Encounter for screening for malignant neoplasm of colon: Secondary | ICD-10-CM

## 2016-09-09 DIAGNOSIS — Z0000 Encounter for general adult medical examination without abnormal findings: Secondary | ICD-10-CM | POA: Diagnosis not present

## 2016-09-09 DIAGNOSIS — R110 Nausea: Secondary | ICD-10-CM

## 2016-09-09 DIAGNOSIS — R634 Abnormal weight loss: Secondary | ICD-10-CM

## 2016-09-09 DIAGNOSIS — Z125 Encounter for screening for malignant neoplasm of prostate: Secondary | ICD-10-CM

## 2016-09-09 DIAGNOSIS — E785 Hyperlipidemia, unspecified: Secondary | ICD-10-CM | POA: Diagnosis not present

## 2016-09-09 DIAGNOSIS — L239 Allergic contact dermatitis, unspecified cause: Secondary | ICD-10-CM | POA: Diagnosis not present

## 2016-09-09 DIAGNOSIS — R809 Proteinuria, unspecified: Secondary | ICD-10-CM

## 2016-09-09 LAB — CBC WITH DIFFERENTIAL/PLATELET
BASOS ABS: 62 {cells}/uL (ref 0–200)
Basophils Relative: 1 %
Eosinophils Absolute: 186 cells/uL (ref 15–500)
Eosinophils Relative: 3 %
HCT: 47.5 % (ref 38.5–50.0)
HEMOGLOBIN: 15.7 g/dL (ref 13.2–17.1)
LYMPHS ABS: 1798 {cells}/uL (ref 850–3900)
LYMPHS PCT: 29 %
MCH: 30 pg (ref 27.0–33.0)
MCHC: 33.1 g/dL (ref 32.0–36.0)
MCV: 90.6 fL (ref 80.0–100.0)
MONO ABS: 806 {cells}/uL (ref 200–950)
MPV: 10.9 fL (ref 7.5–12.5)
Monocytes Relative: 13 %
NEUTROS PCT: 54 %
Neutro Abs: 3348 cells/uL (ref 1500–7800)
Platelets: 163 10*3/uL (ref 140–400)
RBC: 5.24 MIL/uL (ref 4.20–5.80)
RDW: 15.4 % — AB (ref 11.0–15.0)
WBC: 6.2 10*3/uL (ref 3.8–10.8)

## 2016-09-09 NOTE — Patient Instructions (Signed)
Preventive Care 40-64 Years, Male Preventive care refers to lifestyle choices and visits with your health care provider that can promote health and wellness. What does preventive care include?  A yearly physical exam. This is also called an annual well check.  Dental exams once or twice a year.  Routine eye exams. Ask your health care provider how often you should have your eyes checked.  Personal lifestyle choices, including: ? Daily care of your teeth and gums. ? Regular physical activity. ? Eating a healthy diet. ? Avoiding tobacco and drug use. ? Limiting alcohol use. ? Practicing safe sex. ? Taking low-dose aspirin every day starting at age 49. What happens during an annual well check? The services and screenings done by your health care provider during your annual well check will depend on your age, overall health, lifestyle risk factors, and family history of disease. Counseling Your health care provider may ask you questions about your:  Alcohol use.  Tobacco use.  Drug use.  Emotional well-being.  Home and relationship well-being.  Sexual activity.  Eating habits.  Work and work Statistician.  Screening You may have the following tests or measurements:  Height, weight, and BMI.  Blood pressure.  Lipid and cholesterol levels. These may be checked every 5 years, or more frequently if you are over 76 years old.  Skin check.  Lung cancer screening. You may have this screening every year starting at age 49 if you have a 30-pack-year history of smoking and currently smoke or have quit within the past 15 years.  Fecal occult blood test (FOBT) of the stool. You may have this test every year starting at age 49.  Flexible sigmoidoscopy or colonoscopy. You may have a sigmoidoscopy every 5 years or a colonoscopy every 10 years starting at age 49.  Prostate cancer screening. Recommendations will vary depending on your family history and other risks.  Hepatitis C  blood test.  Hepatitis B blood test.  Sexually transmitted disease (STD) testing.  Diabetes screening. This is done by checking your blood sugar (glucose) after you have not eaten for a while (fasting). You may have this done every 1-3 years.  Discuss your test results, treatment options, and if necessary, the need for more tests with your health care provider. Vaccines Your health care provider may recommend certain vaccines, such as:  Influenza vaccine. This is recommended every year.  Tetanus, diphtheria, and acellular pertussis (Tdap, Td) vaccine. You may need a Td booster every 10 years.  Varicella vaccine. You may need this if you have not been vaccinated.  Zoster vaccine. You may need this after age 49.  Measles, mumps, and rubella (MMR) vaccine. You may need at least one dose of MMR if you were born in 1957 or later. You may also need a second dose.  Pneumococcal 13-valent conjugate (PCV13) vaccine. You may need this if you have certain conditions and have not been vaccinated.  Pneumococcal polysaccharide (PPSV23) vaccine. You may need one or two doses if you smoke cigarettes or if you have certain conditions.  Meningococcal vaccine. You may need this if you have certain conditions.  Hepatitis A vaccine. You may need this if you have certain conditions or if you travel or work in places where you may be exposed to hepatitis A.  Hepatitis B vaccine. You may need this if you have certain conditions or if you travel or work in places where you may be exposed to hepatitis B.  Haemophilus influenzae type b (Hib) vaccine.  You may need this if you have certain risk factors.  Talk to your health care provider about which screenings and vaccines you need and how often you need them. This information is not intended to replace advice given to you by your health care provider. Make sure you discuss any questions you have with your health care provider. Document Released: 02/15/2015  Document Revised: 10/09/2015 Document Reviewed: 11/20/2014 Elsevier Interactive Patient Education  2017 [employer]  

## 2016-09-09 NOTE — Progress Notes (Signed)
Name: Isaac Delacruz   MRN: [id number]    DOB: 04/15/1965   Date:09/09/2016       Progress Note  Subjective  Chief Complaint  Chief Complaint  Patient presents with  . Annual Exam    HPI  Male exam: he is engaged, same partner for 8 years, they have 3 children together. He denies ED.   IPSS Questionnaire (AUA-7): Over the past month.   1)  How often have you had a sensation of not emptying your bladder completely after you finish urinating?  0 - Not at all  2)  How often have you had to urinate again less than two hours after you finished urinating? 0 - Not at all  3)  How often have you found you stopped and started again several times when you urinated?  0 - Not at all  4) How difficult have you found it to postpone urination?  0 - Not at all  5) How often have you had a weak urinary stream?  0 - Not at all  6) How often have you had to push or strain to begin urination?  0 - Not at all  7) How many times did you most typically get up to urinate from the time you went to bed until the time you got up in the morning?  1 - 1 time  Total score:  0-7 mildly symptomatic   8-19 moderately symptomatic   20-35 severely symptomatic    HTN: taking medication as prescribed, however I have not given him rx since over 6 months ago. No chest pain, SOB or palpitation. He checks bp at home and it is usually 120's/80's  Metabolic Syndrome: he denies polydipsia, polyuria or polyphagia, last hgbA1C was 5.9%. Taking Metformin and denies diarrhea associated with it.  Hyperlipidemia:  He states he has been taking statin daily, no myalgias, no chest pain   Tobacco use:  denies daily cough, no SOB, there is a family history of lung cancer, and reminded him the importance of quitting smoking. He is down to a few cigarettes a day. Trying to quit.   Allergic Dermatitis: he scratches because he itches, he states it is a habit now, worse at night, not during work hours. Doing well with Atarax at  nighta  OSA: he wears CPAP every night, very seldom wakes up with a headache, he denies day time fatigue. He feels better when he wears it, and does not snore while wearing it  Obesity: he has lost 25 lbs since last year without trying and 15 lbs since May 2018. He is not trying to lose weight, he noticed his weight was down a couple of months ago. He has also noticed a change in bowel movements, from daily to every few days, no decrease in appetite, but has been feeling nauseated over the past few weeks and yesterday had one episode of vomiting. No abdominal pain or blood in stools.   Patient Active Problem List   Diagnosis Date Noted  . Seasonal allergic rhinitis 08/18/2016  . Folliculitis 05/23/2015  . Allergic dermatitis 05/23/2015  . History of Bell's palsy 07/18/2014  . Benign essential HTN 07/18/2014  . Dyslipidemia 07/18/2014  . LBP (low back pain) 07/18/2014  . Vitamin D deficiency 07/18/2014  . Dysmetabolic syndrome 07/18/2014  . Microalbuminuria 07/18/2014  . Extreme obesity 07/18/2014  . Obstructive apnea 07/18/2014  . Tobacco use 07/18/2014    History reviewed. No pertinent surgical history.  Family History  Problem Relation  Age of Onset  . Cancer Mother 31       Lung  . Cancer Father        Prostate  . Diabetes Father   . Hypertension Brother   . Cancer Maternal Aunt        Lung  . Heart disease Brother     Social History   Social History  . Marital status: Married    Spouse name: N/A  . Number of children: N/A  . Years of education: N/A   Occupational History  . Not on file.   Social History Main Topics  . Smoking status: Current Every Day Smoker    Packs/day: 0.25    Years: 19.00    Types: Cigarettes  . Smokeless tobacco: Never Used  . Alcohol use 0.0 oz/week     Comment: rarely  . Drug use: No  . Sexual activity: Yes   Other Topics Concern  . Not on file   Social History Narrative  . No narrative on file     Current Outpatient  Prescriptions:  .  acetaminophen (TYLENOL 8 HOUR) 650 MG CR tablet, Take by mouth., Disp: , Rfl:  .  aspirin 81 MG tablet, Take by mouth., Disp: , Rfl:  .  atorvastatin (LIPITOR) 40 MG tablet, Take 1 tablet (40 mg total) by mouth daily at 6 PM., Disp: 90 tablet, Rfl: 1 .  Cholecalciferol (VITAMIN D HIGH POTENCY) 1000 units capsule, Take by mouth., Disp: , Rfl:  .  fluticasone (FLONASE) 50 MCG/ACT nasal spray, INHALE 2 SPRAYS IN EACH NOSTRIL ONCE DAILY, Disp: 16 g, Rfl: 2 .  hydrOXYzine (ATARAX/VISTARIL) 50 MG tablet, Take 1 tablet (50 mg total) by mouth at bedtime as needed for itching., Disp: 90 tablet, Rfl: 1 .  lisinopril (PRINIVIL,ZESTRIL) 20 MG tablet, Take 1 tablet (20 mg total) by mouth daily., Disp: 90 tablet, Rfl: 1 .  loratadine (CLARITIN) 10 MG tablet, TAKE ONE TABLET BY MOUTH EVERY DAY, Disp: 90 tablet, Rfl: 1 .  metFORMIN (GLUCOPHAGE) 500 MG tablet, Take 1 tablet (500 mg total) by mouth daily with breakfast., Disp: 90 tablet, Rfl: 1 .  minocycline (DYNACIN) 100 MG tablet, Take 1 tablet (100 mg total) by mouth daily. Reported on 05/23/2015, Disp: 90 tablet, Rfl: 1  No Known Allergies   ROS  Constitutional: Negative for fever, positive for  weight loss  Respiratory: Negative for cough and shortness of breath.   Cardiovascular: Negative for chest pain or palpitations.  Gastrointestinal: Negative for abdominal pain, positive  bowel changes, nausea and vomiting.  Musculoskeletal: Negative for gait problem or joint swelling.  Skin: Positive for chronic rash.  Neurological: Negative for dizziness or headache.  No other specific complaints in a complete review of systems (except as listed in HPI above). Objective  Vitals:   09/09/16 0819  BP: 120/68  Pulse: 98  Resp: 16  Temp: 98.5 F (36.9 C)  SpO2: 98%  Weight: 230 lb 9 oz (104.6 kg)  Height: 5' 7.8" (1.722 m)    Body mass index is 35.26 kg/m.  Physical Exam  Constitutional: Patient appears well-developed and obese  No distress.  HENT: Head: Normocephalic and atraumatic. Ears: B TMs ok, no erythema or effusion; Nose: Nose normal. Mouth/Throat: Oropharynx is clear and moist. No oropharyngeal exudate.  Eyes: Conjunctivae and EOM are normal. Pupils are equal, round, and reactive to light. No scleral icterus.  Neck: Normal range of motion. Neck supple. No JVD present. No thyromegaly present.  Cardiovascular: Normal rate, regular  rhythm and normal heart sounds.  No murmur heard. No BLE edema. Pulmonary/Chest: Effort normal and breath sounds normal. No respiratory distress. Abdominal: Soft. Bowel sounds are normal, no distension. There is tenderness during palpation of RUQ,no masses MALE GENITALIA: Normal descended testes bilaterally, no masses palpated, no hernias, no lesions, no discharge RECTAL: Prostate normal size and consistency, no rectal masses or hemorrhoids  Musculoskeletal: Normal range of motion, no joint effusions. No gross deformities Neurological: he is alert and oriented to person, place, and time. No cranial nerve deficit. Coordination, balance, strength, speech and gait are normal.  Skin: Skin is warm and dry. , scarring on scalp from picking on it Psychiatric: Patient has a normal mood and affect. behavior is normal. Judgment and thought content normal.  PHQ2/9: Depression screen [facility] 09/09/2016 11/22/2015 05/23/2015  Decreased Interest 0 0 0  Down, Depressed, Hopeless 0 0 0  PHQ - 2 Score 0 0 0    Fall Risk: Fall Risk  09/09/2016 11/22/2015 05/23/2015  Falls in the past year? No No No     Functional Status Survey: Is the patient deaf or have difficulty hearing?: No Does the patient have difficulty seeing, even when wearing glasses/contacts?: No Does the patient have difficulty concentrating, remembering, or making decisions?: No Does the patient have difficulty walking or climbing stairs?: No Does the patient have difficulty dressing or bathing?: No Does the patient have difficulty  doing errands alone such as visiting a doctor's office or shopping?: No  Current Exercise Habits: The patient does not participate in regular exercise at present      Assessment & Plan  1. Encounter for routine history and physical exam for male  Discussed importance of 150 minutes of physical activity weekly, eat two servings of fish weekly, eat one serving of tree nuts ( cashews, pistachios, pecans, almonds.Marland Kitchen.) every other day, eat 6 servings of fruit/vegetables daily and drink plenty of water and avoid sweet beverages.  - Screen colonoscopy  2. Benign essential HTN  At goal  - CBC with Differential/Platelet - COMPLETE METABOLIC PANEL WITH GFR  3. Dysmetabolic syndrome  - Hemoglobin A1c - Insulin, fasting  4. Dyslipidemia  - Lipid panel  5. Morbid obesity, unspecified obesity type [facility](HCC)  Discussed with the patient the risk posed by an increased BMI. Discussed importance of portion control, calorie counting and at least 150 minutes of physical activity weekly. Avoid sweet beverages and drink more water. Eat at least 6 servings of fruit and vegetables daily   6. Microalbuminuria  Urine micro   7. Obstructive apnea   8. Vitamin D deficiency  - VITAMIN D 25 Hydroxy (Vit-D Deficiency, Fractures)  9. Allergic dermatitis   10. Nausea  - H. pylori breath test - Lipase - Hepatitis, Acute  11. Weight loss  - TSH - H. pylori breath test - Lipase - Hepatitis, Acute - HIV  12. Prostate cancer screening  - PSA

## 2016-09-10 LAB — LIPID PANEL
Cholesterol: 170 mg/dL (ref <200)
HDL: 36 mg/dL — ABNORMAL LOW (ref >40)
LDL CALC: 115 mg/dL — AB (ref <100)
TRIGLYCERIDES: 96 mg/dL (ref <150)
Total CHOL/HDL Ratio: 4.7 Ratio (ref <5.0)
VLDL: 19 mg/dL (ref <30)

## 2016-09-10 LAB — HEPATITIS PANEL, ACUTE
HCV AB: NONREACTIVE
Hep A IgM: NONREACTIVE
Hep B C IgM: NONREACTIVE
Hepatitis B Surface Ag: NONREACTIVE

## 2016-09-10 LAB — COMPLETE METABOLIC PANEL WITH GFR
ALK PHOS: 55 U/L (ref 40–115)
ALT: 11 U/L (ref 9–46)
AST: 18 U/L (ref 10–40)
Albumin: 4.4 g/dL (ref 3.6–5.1)
BUN: 15 mg/dL (ref 7–25)
CHLORIDE: 106 mmol/L (ref 98–110)
CO2: 27 mmol/L (ref 20–32)
CREATININE: 1.18 mg/dL (ref 0.60–1.35)
Calcium: 9.1 mg/dL (ref 8.6–10.3)
GFR, EST AFRICAN AMERICAN: 83 mL/min (ref >=60)
GFR, Est Non African American: 72 mL/min (ref >=60)
Glucose, Bld: 92 mg/dL (ref 65–99)
Potassium: 4 mmol/L (ref 3.5–5.3)
Sodium: 140 mmol/L (ref 135–146)
Total Bilirubin: 0.9 mg/dL (ref 0.2–1.2)
Total Protein: 7.3 g/dL (ref 6.1–8.1)

## 2016-09-10 LAB — MICROALBUMIN / CREATININE URINE RATIO
Creatinine, Urine: 547 mg/dL — ABNORMAL HIGH (ref 20–370)
MICROALB UR: 1.9 mg/dL
Microalb Creat Ratio: 3 mcg/mg creat (ref <30)

## 2016-09-10 LAB — HIV ANTIBODY (ROUTINE TESTING W REFLEX): HIV 1&2 Ab, 4th Generation: NONREACTIVE

## 2016-09-10 LAB — HEMOGLOBIN A1C
HEMOGLOBIN A1C: 5.4 % (ref <5.7)
MEAN PLASMA GLUCOSE: 108 mg/dL

## 2016-09-10 LAB — LIPASE: Lipase: 6 U/L — ABNORMAL LOW (ref 7–60)

## 2016-09-10 LAB — H. PYLORI BREATH TEST: H. PYLORI BREATH TEST: NOT DETECTED

## 2016-09-10 LAB — TSH: TSH: 0.93 m[IU]/L (ref 0.40–4.50)

## 2016-09-10 LAB — PSA: PSA: 0.8 ng/mL (ref <=4.0)

## 2016-09-10 LAB — INSULIN, FASTING: Insulin fasting, serum: 8.7 u[IU]/mL (ref 2.0–19.6)

## 2016-09-11 ENCOUNTER — Other Ambulatory Visit: Payer: Self-pay | Admitting: Family Medicine

## 2016-09-11 ENCOUNTER — Telehealth: Payer: Self-pay

## 2016-09-11 ENCOUNTER — Telehealth: Payer: Self-pay | Admitting: Family Medicine

## 2016-09-11 LAB — VITAMIN D 25 HYDROXY (VIT D DEFICIENCY, FRACTURES): Vit D, 25-Hydroxy: 15 ng/mL — ABNORMAL LOW (ref 30–100)

## 2016-09-11 MED ORDER — VITAMIN D (ERGOCALCIFEROL) 1.25 MG (50000 UNIT) PO CAPS: 50000.0000 [IU] | ORAL_CAPSULE | ORAL | 0 refills | Status: DC

## 2016-09-11 NOTE — Telephone Encounter (Signed)
Pt was notified of all results by tiffany

## 2016-09-11 NOTE — Telephone Encounter (Signed)
-----   Message from Alba CoryKrichna Sowles, MD sent at 09/11/2016 12:55 PM EDT ----- Vitamin D is low, I will send prescription vitamin D to  pharmacy and once finished she/he needs to take otc vitamin D 2000 units daily Mild increase in fasting insulin  H. Pylori negative Normal PSA Normal lipase - not his pancreas Sugar, kidney and transaminases are within normal limits Lipid panel has improved, continue current regiment Normal TSH  Negative acute hepatitis panel  Normal hgbA1C and CBC Normal urine micro

## 2016-10-12 ENCOUNTER — Encounter: Payer: Self-pay | Admitting: Family Medicine

## 2016-10-12 ENCOUNTER — Ambulatory Visit (INDEPENDENT_AMBULATORY_CARE_PROVIDER_SITE_OTHER): Payer: BLUE CROSS/BLUE SHIELD | Admitting: Family Medicine

## 2016-10-12 VITALS — BP 116/72 | HR 83 | Temp 98.4°F | Resp 16 | Ht 68.0 in | Wt 230.2 lb

## 2016-10-12 DIAGNOSIS — R634 Abnormal weight loss: Secondary | ICD-10-CM | POA: Diagnosis not present

## 2016-10-12 DIAGNOSIS — R198 Other specified symptoms and signs involving the digestive system and abdomen: Secondary | ICD-10-CM

## 2016-10-12 DIAGNOSIS — Z23 Encounter for immunization: Secondary | ICD-10-CM | POA: Diagnosis not present

## 2016-10-12 NOTE — Progress Notes (Signed)
Name: Isaac Delacruz   MRN: [id number]    DOB: Dec 22, 1965   Date:10/12/2016       Progress Note  Subjective  Chief Complaint  Chief Complaint  Patient presents with  . Follow-up    1 month F/U  . Weight Loss    Unexplained weight loss, Has losted around 20 pounds since May until August.     HPI  Obesity: he has lost 25 lbs since last year without trying and 15 lbs in 3 months, but since last visit ( one month ago he just lost 1 more pound) He is not trying to lose weight, he noticed his weight was down a couple of months ago. He had also noticed a change in bowel movements, from daily to every few days, no decrease in appetite, occasionally he feels nausea, no abdominal pain, blood in stools or vomiting. We scheduled a colonoscopy for him - he has an appointment on 09/20 with GI. He states bowel movements have improved since last visit, about 3 times a week now, but again - it used to be daily  Patient Active Problem List   Diagnosis Date Noted  . Seasonal allergic rhinitis 08/18/2016  . Folliculitis 22/63/3354  . Allergic dermatitis 05/23/2015  . History of Bell's palsy 07/18/2014  . Benign essential HTN 07/18/2014  . Dyslipidemia 07/18/2014  . LBP (low back pain) 07/18/2014  . Vitamin D deficiency 07/18/2014  . Dysmetabolic syndrome 56/25/6389  . Microalbuminuria 07/18/2014  . Extreme obesity 07/18/2014  . Obstructive apnea 07/18/2014  . Tobacco use 07/18/2014    History reviewed. No pertinent surgical history.  Family History  Problem Relation Age of Onset  . Cancer Mother 34       Lung  . Cancer Father        Prostate  . Diabetes Father   . Hypertension Brother   . Cancer Maternal Aunt        Lung  . Heart disease Brother     Social History   Social History  . Marital status: Married    Spouse name: N/A  . Number of children: N/A  . Years of education: N/A   Occupational History  . Not on file.   Social History Main Topics  . Smoking status: Current  Every Day Smoker    Packs/day: 0.25    Years: 19.00    Types: Cigarettes, Cigars    Start date: 10/12/1997  . Smokeless tobacco: Never Used  . Alcohol use 0.0 oz/week     Comment: rarely  . Drug use: No  . Sexual activity: Yes    Partners: Female   Other Topics Concern  . Not on file   Social History Narrative   Works full time at [employer] with fiancee and they have 3 children together     Current Outpatient Prescriptions:  .  acetaminophen (TYLENOL 8 HOUR) 650 MG CR tablet, Take by mouth., Disp: , Rfl:  .  aspirin 81 MG tablet, Take by mouth., Disp: , Rfl:  .  atorvastatin (LIPITOR) 40 MG tablet, Take 1 tablet (40 mg total) by mouth daily at 6 PM., Disp: 90 tablet, Rfl: 1 .  Cholecalciferol (VITAMIN D HIGH POTENCY) 1000 units capsule, Take by mouth., Disp: , Rfl:  .  fluticasone (FLONASE) 50 MCG/ACT nasal spray, INHALE 2 SPRAYS IN EACH NOSTRIL ONCE DAILY, Disp: 16 g, Rfl: 2 .  hydrOXYzine (ATARAX/VISTARIL) 50 MG tablet, Take 1 tablet (50 mg total) by mouth at bedtime  as needed for itching., Disp: 90 tablet, Rfl: 1 .  lisinopril (PRINIVIL,ZESTRIL) 20 MG tablet, Take 1 tablet (20 mg total) by mouth daily., Disp: 90 tablet, Rfl: 1 .  loratadine (CLARITIN) 10 MG tablet, TAKE ONE TABLET BY MOUTH EVERY DAY, Disp: 90 tablet, Rfl: 1 .  metFORMIN (GLUCOPHAGE) 500 MG tablet, Take 1 tablet (500 mg total) by mouth daily with breakfast., Disp: 90 tablet, Rfl: 1 .  minocycline (DYNACIN) 100 MG tablet, Take 1 tablet (100 mg total) by mouth daily. Reported on 05/23/2015, Disp: 90 tablet, Rfl: 1 .  Vitamin D, Ergocalciferol, (DRISDOL) 50000 units CAPS capsule, Take 1 capsule (50,000 Units total) by mouth every 7 (seven) days., Disp: 12 capsule, Rfl: 0  No Known Allergies   ROS  Constitutional: Negative for fever or weight change.  Respiratory: Negative for cough and shortness of breath.   Cardiovascular: Negative for chest pain or palpitations.  Gastrointestinal: Negative  for abdominal pain, no bowel changes.  Musculoskeletal: Negative for gait problem or joint swelling.  Skin: stablerash.  Neurological: Negative for dizziness or headache.  No other specific complaints in a complete review of systems (except as listed in HPI above).  Objective  Vitals:   10/12/16 1011  BP: 116/72  Pulse: 83  Resp: 16  Temp: 98.4 F (36.9 C)  TempSrc: Oral  SpO2: 98%  Weight: 230 lb 3.2 oz (104.4 kg)  Height: '5\' 8"'  (1.727 m)    Body mass index is 35 kg/m.  Physical Exam  Constitutional: Patient appears well-developed and well-nourished. Obese  No distress.  HEENT: head atraumatic, normocephalic, pupils equal and reactive to light,  neck supple, throat within normal limits Cardiovascular: Normal rate, regular rhythm and normal heart sounds.  No murmur heard. No BLE edema. Pulmonary/Chest: Effort normal and breath sounds normal. No respiratory distress. Abdominal: Soft.  There is no tenderness. Psychiatric: Patient has a normal mood and affect. behavior is normal. Judgment and thought content normal.  Recent Results (from the past 2160 hour(s))  Urine Microalbumin w/creat. ratio     Status: Abnormal   Collection Time: 09/09/16  8:43 AM  Result Value Ref Range   Creatinine, Urine 547 (H) 20 - 370 mg/dL    Comment: Result repeated and verified. Result confirmed by automatic dilution.    Microalb, Ur 1.9 Not estab mg/dL   Microalb Creat Ratio 3 <30 mcg/mg creat    Comment: The ADA has defined abnormalities in albumin excretion as follows:           Category           Result                            (mcg/mg creatinine)                 Normal:    <30       Microalbuminuria:    30 - 299   Clinical albuminuria:    > or = 300   The ADA recommends that at least two of three specimens collected within a 3 - 6 month period be abnormal before considering a patient to be within a diagnostic category.     HIV antibody     Status: None   Collection Time:  09/09/16  8:45 AM  Result Value Ref Range   HIV 1&2 Ab, 4th Generation NONREACTIVE NONREACTIVE    Comment:   HIV-1 antigen and HIV-1/HIV-2 antibodies were not detected.  There is no laboratory evidence of HIV infection.   HIV-1/2 Antibody Diff        Not indicated. HIV-1 RNA, Qual TMA          Not indicated.     PLEASE NOTE: This information has been disclosed to you from records whose confidentiality may be protected by state law. If your state requires such protection, then the state law prohibits you from making any further disclosure of the information without the specific written consent of the person to whom it pertains, or as otherwise permitted by law. A general authorization for the release of medical or other information is NOT sufficient for this purpose.   The performance of this assay has not been clinically validated in patients less than 29 years old.   For additional information please refer to http://education.questdiagnostics.com/faq/FAQ106.  (This link is being provided for informational/educational purposes only.)     CBC with Differential/Platelet     Status: Abnormal   Collection Time: 09/09/16  9:18 AM  Result Value Ref Range   WBC 6.2 3.8 - 10.8 K/uL   RBC 5.24 4.20 - 5.80 MIL/uL   Hemoglobin 15.7 13.2 - 17.1 g/dL   HCT 47.5 38.5 - 50.0 %   MCV 90.6 80.0 - 100.0 fL   MCH 30.0 27.0 - 33.0 pg   MCHC 33.1 32.0 - 36.0 g/dL   RDW 15.4 (H) 11.0 - 15.0 %   Platelets 163 140 - 400 K/uL   MPV 10.9 7.5 - 12.5 fL   Neutro Abs 3,348 1,500 - 7,800 cells/uL   Lymphs Abs 1,798 850 - 3,900 cells/uL   Monocytes Absolute 806 200 - 950 cells/uL   Eosinophils Absolute 186 15 - 500 cells/uL   Basophils Absolute 62 0 - 200 cells/uL   Neutrophils Relative % 54 %   Lymphocytes Relative 29 %   Monocytes Relative 13 %   Eosinophils Relative 3 %   Basophils Relative 1 %   Smear Review Criteria for review not met   COMPLETE METABOLIC PANEL WITH GFR     Status: None    Collection Time: 09/09/16  9:18 AM  Result Value Ref Range   Sodium 140 135 - 146 mmol/L   Potassium 4.0 3.5 - 5.3 mmol/L   Chloride 106 98 - 110 mmol/L   CO2 27 20 - 32 mmol/L    Comment: ** Please note change in reference range(s). **      Glucose, Bld 92 65 - 99 mg/dL   BUN 15 7 - 25 mg/dL   Creat 1.18 0.60 - 1.35 mg/dL   Total Bilirubin 0.9 0.2 - 1.2 mg/dL   Alkaline Phosphatase 55 40 - 115 U/L   AST 18 10 - 40 U/L   ALT 11 9 - 46 U/L   Total Protein 7.3 6.1 - 8.1 g/dL   Albumin 4.4 3.6 - 5.1 g/dL   Calcium 9.1 8.6 - 10.3 mg/dL   GFR, Est African American 83 >=60 mL/min   GFR, Est Non African American 72 >=60 mL/min  Hemoglobin A1c     Status: None   Collection Time: 09/09/16  9:18 AM  Result Value Ref Range   Hgb A1c MFr Bld 5.4 <5.7 %    Comment:   For the purpose of screening for the presence of diabetes:   <5.7%       Consistent with the absence of diabetes 5.7-6.4 %   Consistent with increased risk for diabetes (prediabetes) >=6.5 %  Consistent with diabetes   This assay result is consistent with a decreased risk of diabetes.   Currently, no consensus exists regarding use of hemoglobin A1c for diagnosis of diabetes in children.   According to American Diabetes Association (ADA) guidelines, hemoglobin A1c <7.0% represents optimal control in non-pregnant diabetic patients. Different metrics may apply to specific patient populations. Standards of Medical Care in Diabetes (ADA).      Mean Plasma Glucose 108 mg/dL  Insulin, fasting     Status: None   Collection Time: 09/09/16  9:18 AM  Result Value Ref Range   Insulin fasting, serum 8.7 2.0 - 19.6 uIU/mL    Comment:   This insulin assay shows strong cross-reactivity for some insulin analogs (lispro, aspart, and glargine) and much lower cross-reactivity with others (detemir, glulisine).   Stimulated Insulin reference intervals were established using the Siemens Immulite assay. These values are provided for  general guidance only.   Lipid panel     Status: Abnormal   Collection Time: 09/09/16  9:18 AM  Result Value Ref Range   Cholesterol 170 <200 mg/dL   Triglycerides 96 <150 mg/dL   HDL 36 (L) >40 mg/dL   Total CHOL/HDL Ratio 4.7 <5.0 Ratio   VLDL 19 <30 mg/dL   LDL Cholesterol 115 (H) <100 mg/dL  VITAMIN D 25 Hydroxy (Vit-D Deficiency, Fractures)     Status: Abnormal   Collection Time: 09/09/16  9:18 AM  Result Value Ref Range   Vit D, 25-Hydroxy 15 (L) 30 - 100 ng/mL    Comment: Vitamin D Status           25-OH Vitamin D        Deficiency                <20 ng/mL        Insufficiency         20 - 29 ng/mL        Optimal             > or = 30 ng/mL   For 25-OH Vitamin D testing on patients on D2-supplementation and patients for whom quantitation of D2 and D3 fractions is required, the [facility] 25-OH VIT D, (D2,D3), LC/MS/MS is recommended: order code [id number] (patients > 2 yrs).   TSH     Status: None   Collection Time: 09/09/16  9:18 AM  Result Value Ref Range   TSH 0.93 0.40 - 4.50 mIU/L  PSA     Status: None   Collection Time: 09/09/16  9:18 AM  Result Value Ref Range   PSA 0.8 <=4.0 ng/mL    Comment:   The total PSA value from this assay system is standardized against the WHO standard. The test result will be approximately 20% lower when compared to the equimolar-standardized total PSA ([employer]). Comparison of serial PSA results should be interpreted with this fact in mind.   This test was performed using the Siemens chemiluminescent method. Values obtained from different assay methods cannot be used interchangeably. PSA levels, regardless of value, should not be interpreted as absolute evidence of the presence or absence of disease.     H. pylori breath test     Status: None   Collection Time: 09/09/16  9:18 AM  Result Value Ref Range   H. pylori Breath Test NOT DETECTED Not Detected    Comment:   Antimicrobials, proton pump inhibitors, and bismuth  preparations are known to suppress H. pylori, and ingestion of these prior to  H. pylori diagnostic testing may lead to false negative results. If clinically indicated, the test may be repeated on a new specimen obtained two weeks after discontinuing treatment.     Lipase     Status: Abnormal   Collection Time: 09/09/16  9:18 AM  Result Value Ref Range   Lipase 6 (L) 7 - 60 U/L  Hepatitis, Acute     Status: None   Collection Time: 09/09/16  9:18 AM  Result Value Ref Range   Hepatitis B Surface Ag NON-REACTIVE NON-REACTIVE   HCV Ab NON-REACTIVE NON-REACTIVE   Hep B C IgM NON REACTIVE NON-REACTIVE   Hep A IgM NON-REACTIVE NON-REACTIVE    Comment:   Effective December 18, 2013, Hepatitis Acute Panel (test code [id number]) will be revised to automatically reflex to the Hepatitis C Viral RNA, Quantitative, Real-Time PCR assay if the Hepatitis C antibody screening result is Reactive. This action is being taken to ensure that the CDC/USPSTF recommended HCV diagnostic algorithm with the appropriate test reflex needed for accurate interpretation is followed.         PHQ2/9: Depression screen [facility] 2/9 09/09/2016 11/22/2015 05/23/2015  Decreased Interest 0 0 0  Down, Depressed, Hopeless 0 0 0  PHQ - 2 Score 0 0 0     Fall Risk: Fall Risk  09/09/2016 11/22/2015 05/23/2015  Falls in the past year? No No No    Assessment & Plan  1. Weight loss  Stable now, reviewed all labs with patient, and very reassuring, he has a colonoscopy scheduled. Advised to return sooner if he loses weight quickly again, discussed balanced meals  2. Needs flu shot  - Flu Vaccine QUAD 6+ mos PF IM (Fluarix Quad PF)  3. Change in bowel movement  Keep follow up GII

## 2016-10-22 ENCOUNTER — Encounter: Payer: Self-pay | Admitting: Gastroenterology

## 2016-10-22 ENCOUNTER — Other Ambulatory Visit: Payer: Self-pay

## 2016-10-22 ENCOUNTER — Ambulatory Visit (INDEPENDENT_AMBULATORY_CARE_PROVIDER_SITE_OTHER): Payer: BLUE CROSS/BLUE SHIELD | Admitting: Gastroenterology

## 2016-10-22 VITALS — BP 144/89 | HR 87 | Temp 98.1°F | Ht 68.0 in | Wt 231.4 lb

## 2016-10-22 DIAGNOSIS — R634 Abnormal weight loss: Secondary | ICD-10-CM

## 2016-10-22 DIAGNOSIS — Z1211 Encounter for screening for malignant neoplasm of colon: Secondary | ICD-10-CM

## 2016-10-22 NOTE — Progress Notes (Signed)
Arlyss Repress, MD [address]  [address]  [place], [place] [address]  Main: [phone]  Fax: [phone]    Gastroenterology Consultation  Referring Provider:     Alba Cory, MD Primary Care Physician:  Alba Cory, MD Primary Gastroenterologist:  Dr. Arlyss Repress Reason for Consultation:     Weight loss        HPI:   Isaac Delacruz is a 51 y.o. African-American male referred by Dr. Alba Cory, MD  for consultation & management of Weight loss and the need for colonoscopy. He has history of hypertension and diabetes which are under control. He had workup for unintentional weight loss of 15 pounds since 06/2016 to 09/2016 from 245lbs to 230Lbs. And, he also reported some lower abdominal pain at the time. His PCP performed workup for weight loss including HIV nonreactive, hemoglobin A1c normal, TSH normal, lipase normal, acute hepatitis panel negative, H pylori breath test negative. He is therefore referred to GI for further recommendations. Patient reports that he has took up 2 jobs and has been working hard during the time of his weight loss. He is a meal fixer. His weight has been stable at 230 pounds since 09/2016, during last 3 visits in his chart. He denies any abdominal pain, rectal bleeding, altered bowel habits, change in stool caliber, upper GI symptoms. He denies fever, chills, loss of appetite. He does not have iron deficiency anemia. He did not have any abdominal surgeries. His last colonoscopy was in 2010 and it was reportedly normal. His father was diagnosed with colon cancer in 48s.  GI Procedures: Colonoscopy 01/02/2009 normal  Past Medical History:  Diagnosis Date  . Bell's palsy   . Folliculitis    Dr. Lisabeth Devoid in [place]  . Hyperlipidemia   . Hypertension   . Intermittent low back pain   . Low serum vitamin D   . Metabolic syndrome   . Microalbuminuria   . Morbid obesity (HCC)   . OSA on CPAP     No past surgical history on  file.  Prior to Admission medications   Medication Sig Start Date End Date Taking? Authorizing Provider  acetaminophen (TYLENOL 8 HOUR) 650 MG CR tablet Take by mouth. 03/13/13   [provider]  aspirin 81 MG tablet Take by mouth.    [provider]  atorvastatin (LIPITOR) 40 MG tablet Take 1 tablet (40 mg total) by mouth daily at 6 PM. 11/22/15   Carlynn Purl, Danna Hefty, MD  Cholecalciferol (VITAMIN D HIGH POTENCY) 1000 units capsule Take by mouth. 04/03/11   [provider]  fluticasone (FLONASE) 50 MCG/ACT nasal spray INHALE 2 SPRAYS IN EACH NOSTRIL ONCE DAILY 08/18/16   Doren Custard, FNP  hydrOXYzine (ATARAX/VISTARIL) 50 MG tablet Take 1 tablet (50 mg total) by mouth at bedtime as needed for itching. 11/22/15   Alba Cory, MD  lisinopril (PRINIVIL,ZESTRIL) 20 MG tablet Take 1 tablet (20 mg total) by mouth daily. 11/22/15   Alba Cory, MD  loratadine (CLARITIN) 10 MG tablet TAKE ONE TABLET BY MOUTH EVERY DAY 05/27/16   Alba Cory, MD  metFORMIN (GLUCOPHAGE) 500 MG tablet Take 1 tablet (500 mg total) by mouth daily with breakfast. 11/22/15   Alba Cory, MD  minocycline (DYNACIN) 100 MG tablet Take 1 tablet (100 mg total) by mouth daily. Reported on 05/23/2015 11/22/15   Alba Cory, MD  Vitamin D, Ergocalciferol, (DRISDOL) 50000 units CAPS capsule Take 1 capsule (50,000 Units total) by mouth every  7 (seven) days. 09/11/16   Alba Cory, MD    Family History  Problem Relation Age of Onset  . Cancer Mother 5       Lung  . Cancer Father        Prostate  . Diabetes Father   . Hypertension Brother   . Cancer Maternal Aunt        Lung  . Heart disease Brother      Social History  Substance Use Topics  . Smoking status: Current Every Day Smoker    Packs/day: 0.25    Years: 19.00    Types: Cigarettes, Cigars    Start date: 10/12/1997  . Smokeless tobacco: Never Used  . Alcohol use 0.0 oz/week     Comment: rarely    Allergies as of  10/22/2016  . (No Known Allergies)    Review of Systems:    All systems reviewed and negative except where noted in HPI.   Physical Exam:  BP (!) 144/89   Pulse 87   Temp 98.1 F (36.7 C) (Oral)   Ht  (1.727 m)   Wt 231 lb 6.4 oz (105 kg)   BMI 35.18 kg/m  No LMP for male patient.  General:   Alert,  Well-developed, well-nourished, pleasant and cooperative in NAD Head:  Normocephalic and atraumatic. Eyes:  Sclera clear, no icterus.   Conjunctiva pink. Ears:  Normal auditory acuity. Nose:  No deformity, discharge, or lesions. Mouth:  No deformity or lesions,oropharynx pink & moist. Neck:  Supple; no masses or thyromegaly. Lungs:  Respirations even and unlabored.  Clear throughout to auscultation.   No wheezes, crackles, or rhonchi. No acute distress. Heart:  Regular rate and rhythm; no murmurs, clicks, rubs, or gallops. Abdomen:  Normal bowel sounds.  No bruits.  Soft, non-tender and non-distended without masses, hepatosplenomegaly or hernias noted.  No guarding or rebound tenderness.   Rectal: Nor performed Msk:  Symmetrical without gross deformities. Good, equal movement & strength bilaterally. Pulses:  Normal pulses noted. Extremities:  No clubbing or edema.  No cyanosis. Neurologic:  Alert and oriented x3;  grossly normal neurologically. Psych:  Alert and cooperative. Normal mood and affect.  Imaging Studies: None  Assessment and Plan:   Isaac Delacruz is a 51 y.o. y/oAfrican American male with well-controlled diabetes and hypertension with unintentional weight loss of 15 pounds in 3 months. His weight has been stable in last 1 month. Most likely secondary to increased physical activity with 2 jobs. He does not have any other constitutional symptoms. He is due for colon cancer screening given his age and ethnicity. Schedule colonoscopy. Further workup from GI standpoint.  Follow up based on the colonoscopy results   Arlyss Repress, MD

## 2016-11-04 ENCOUNTER — Encounter: Payer: Self-pay | Admitting: *Deleted

## 2016-11-10 NOTE — Discharge Instructions (Signed)
General Anesthesia, Adult, Care After °These instructions provide you with information about caring for yourself after your procedure. Your health care provider may also give you more specific instructions. Your treatment has been planned according to current medical practices, but problems sometimes occur. Call your health care provider if you have any problems or questions after your procedure. °What can I expect after the procedure? °After the procedure, it is common to have: °· Vomiting. °· A sore throat. °· Mental slowness. ° °It is common to feel: °· Nauseous. °· Cold or shivery. °· Sleepy. °· Tired. °· Sore or achy, even in parts of your body where you did not have surgery. ° °Follow these instructions at home: °For at least 24 hours after the procedure: °· Do not: °? Participate in activities where you could fall or become injured. °? Drive. °? Use heavy machinery. °? Drink alcohol. °? Take sleeping pills or medicines that cause drowsiness. °? Make important decisions or sign legal documents. °? Take care of children on your own. °· Rest. °Eating and drinking °· If you vomit, drink water, juice, or soup when you can drink without vomiting. °· Drink enough fluid to keep your urine clear or pale yellow. °· Make sure you have little or no nausea before eating solid foods. °· Follow the diet recommended by your health care provider. °General instructions °· Have a responsible adult stay with you until you are awake and alert. °· Return to your normal activities as told by your health care provider. Ask your health care provider what activities are safe for you. °· Take over-the-counter and prescription medicines only as told by your health care provider. °· If you smoke, do not smoke without supervision. °· Keep all follow-up visits as told by your health care provider. This is important. °Contact a health care provider if: °· You continue to have nausea or vomiting at home, and medicines are not helpful. °· You  cannot drink fluids or start eating again. °· You cannot urinate after 8-12 hours. °· You develop a skin rash. °· You have fever. °· You have increasing redness at the site of your procedure. °Get help right away if: °· You have difficulty breathing. °· You have chest pain. °· You have unexpected bleeding. °· You feel that you are having a life-threatening or urgent problem. °This information is not intended to replace advice given to you by your health care provider. Make sure you discuss any questions you have with your health care provider. °Document Released: 04/27/2000 Document Revised: 06/24/2015 Document Reviewed: 01/03/2015 °Elsevier Interactive Patient Education © 2018 [employer] ° °

## 2016-11-11 ENCOUNTER — Ambulatory Visit
Admission: RE | Admit: 2016-11-11 | Discharge: 2016-11-11 | Disposition: A | Discharge status: Home / Self Care | Payer: BLUE CROSS/BLUE SHIELD | Source: Ambulatory Visit | Attending: Gastroenterology | Admitting: Gastroenterology

## 2016-11-11 ENCOUNTER — Ambulatory Visit: Payer: BLUE CROSS/BLUE SHIELD | Admitting: Anesthesiology

## 2016-11-11 ENCOUNTER — Encounter
Admission: RE | Disposition: A | Discharge status: Home / Self Care | Payer: Self-pay | Source: Ambulatory Visit | Attending: Gastroenterology

## 2016-11-11 DIAGNOSIS — Z79899 Other long term (current) drug therapy: Secondary | ICD-10-CM | POA: Insufficient documentation

## 2016-11-11 DIAGNOSIS — G4733 Obstructive sleep apnea (adult) (pediatric): Secondary | ICD-10-CM | POA: Diagnosis not present

## 2016-11-11 DIAGNOSIS — Z7982 Long term (current) use of aspirin: Secondary | ICD-10-CM | POA: Diagnosis not present

## 2016-11-11 DIAGNOSIS — R634 Abnormal weight loss: Secondary | ICD-10-CM | POA: Diagnosis not present

## 2016-11-11 DIAGNOSIS — F17210 Nicotine dependence, cigarettes, uncomplicated: Secondary | ICD-10-CM | POA: Insufficient documentation

## 2016-11-11 DIAGNOSIS — I10 Essential (primary) hypertension: Secondary | ICD-10-CM | POA: Diagnosis not present

## 2016-11-11 DIAGNOSIS — E785 Hyperlipidemia, unspecified: Secondary | ICD-10-CM | POA: Insufficient documentation

## 2016-11-11 DIAGNOSIS — Z791 Long term (current) use of non-steroidal anti-inflammatories (NSAID): Secondary | ICD-10-CM | POA: Diagnosis not present

## 2016-11-11 HISTORY — DX: Dermatitis, unspecified: L30.9

## 2016-11-11 HISTORY — PX: COLONOSCOPY WITH PROPOFOL: SHX5780

## 2016-11-11 LAB — GLUCOSE, CAPILLARY: GLUCOSE-CAPILLARY: 94 mg/dL (ref 65–99)

## 2016-11-11 SURGERY — COLONOSCOPY WITH PROPOFOL
Anesthesia: General | Wound class: Clean Contaminated

## 2016-11-11 MED ORDER — LIDOCAINE HCL (CARDIAC) 20 MG/ML IV SOLN
INTRAVENOUS | Status: DC | PRN
  Administered 2016-11-11: 40 mg via INTRAVENOUS

## 2016-11-11 MED ORDER — LACTATED RINGERS IV SOLN
1000.0000 mL | INTRAVENOUS | Status: DC
  Administered 2016-11-11: 1000 mL via INTRAVENOUS

## 2016-11-11 MED ORDER — PROPOFOL 10 MG/ML IV BOLUS
INTRAVENOUS | Status: DC | PRN
  Administered 2016-11-11 (×14): 50 mg via INTRAVENOUS

## 2016-11-11 SURGICAL SUPPLY — 23 items

## 2016-11-11 NOTE — H&P (Signed)
Isaac Repress, MD [address]  [address]  [place], [place] [address]  Main: [phone]  Fax: [phone] Pager: [phone]  Primary Care Physician:  Isaac Cory, MD Primary Gastroenterologist:  Dr. Arlyss Delacruz  Pre-Procedure History & Physical: HPI:  Isaac Delacruz is a 51 y.o. male is here for an colonoscopy.   Past Medical History:  Diagnosis Date  . Bell's palsy   . Eczema   . Folliculitis    Dr. Lisabeth Delacruz in [place]  . Hyperlipidemia   . Hypertension   . Intermittent low back pain   . Low serum vitamin D   . Metabolic syndrome   . Microalbuminuria   . Morbid obesity (HCC)   . OSA on CPAP     Past Surgical History:  Procedure Laterality Date  . COLONOSCOPY      Prior to Admission medications   Medication Sig Start Date End Date Taking? Authorizing Provider  atorvastatin (LIPITOR) 40 MG tablet Take 1 tablet (40 mg total) by mouth daily at 6 PM. 11/22/15  Yes Delacruz, Isaac Hefty, MD  Cholecalciferol (VITAMIN D HIGH POTENCY) 1000 units capsule Take by mouth. 04/03/11  Yes [provider]  fluticasone (FLONASE) 50 MCG/ACT nasal spray INHALE 2 SPRAYS IN EACH NOSTRIL ONCE DAILY 08/18/16  Yes Isaac Custard, FNP  hydrOXYzine (ATARAX/VISTARIL) 50 MG tablet Take 1 tablet (50 mg total) by mouth at bedtime as needed for itching. 11/22/15  Yes Delacruz, Isaac Hefty, MD  lisinopril (PRINIVIL,ZESTRIL) 20 MG tablet Take 1 tablet (20 mg total) by mouth daily. 11/22/15  Yes Delacruz, Isaac Hefty, MD  loratadine (CLARITIN) 10 MG tablet TAKE ONE TABLET BY MOUTH EVERY DAY 05/27/16  Yes Delacruz, Isaac Hefty, MD  metFORMIN (GLUCOPHAGE) 500 MG tablet Take 1 tablet (500 mg total) by mouth daily with breakfast. 11/22/15  Yes Delacruz, Isaac Hefty, MD  minocycline (DYNACIN) 100 MG tablet Take 1 tablet (100 mg total) by mouth daily. Reported on 05/23/2015 11/22/15  Yes Delacruz, Isaac Hefty, MD  Vitamin D, Ergocalciferol, (DRISDOL) 50000 units CAPS capsule Take 1 capsule (50,000 Units total) by  mouth every 7 (seven) days. 09/11/16  Yes Delacruz, Isaac Hefty, MD  acetaminophen (TYLENOL 8 HOUR) 650 MG CR tablet Take by mouth. 03/13/13   [provider]  aspirin 81 MG tablet Take by mouth.    [provider]    Allergies as of 10/22/2016  . (No Known Allergies)    Family History  Problem Relation Age of Onset  . Cancer Mother 35       Lung  . Cancer Father        Prostate  . Diabetes Father   . Hypertension Brother   . Cancer Maternal Aunt        Lung  . Heart disease Brother     Social History   Social History  . Marital status: Married    Spouse name: N/A  . Number of children: N/A  . Years of education: N/A   Occupational History  . Not on file.   Social History Main Topics  . Smoking status: Current Every Day Smoker    Packs/day: 0.00    Years: 19.00    Types: Cigars    Start date: 10/12/1997  . Smokeless tobacco: Never Used     Comment: 2-3 cigars/day  . Alcohol use 0.0 oz/week     Comment: rarely  . Drug use: No  . Sexual activity: Yes    Partners: Female   Other Topics Concern  . Not  on file   Social History Narrative   Works full time at [employer] with fiancee and they have 3 children together    Review of Systems: See HPI, otherwise negative ROS  Physical Exam: BP (!) 130/91   Pulse 71   Temp 98.1 F (36.7 C) (Temporal)   Ht  (1.727 m)   Wt 226 lb (102.5 kg)   SpO2 100%   BMI 34.36 kg/m  General:   Alert,  pleasant and cooperative in NAD Head:  Normocephalic and atraumatic. Neck:  Supple; no masses or thyromegaly. Lungs:  Clear throughout to auscultation.    Heart:  Regular rate and rhythm. Abdomen:  Soft, nontender and nondistended. Normal bowel sounds, without guarding, and without rebound.   Neurologic:  Alert and  oriented x4;  grossly normal neurologically.  Impression/Plan: [name] is here for an colonoscopy to be performed for weight loss  Risks, benefits, limitations, and  alternatives regarding  colonoscopy have been reviewed with the patient.  Questions have been answered.  All parties agreeable.   Lannette Donath, MD  11/11/2016, 7:55 AM

## 2016-11-11 NOTE — Transfer of Care (Signed)
Immediate Anesthesia Transfer of Care Note  Patient: Isaac Delacruz  Procedure(s) Performed: COLONOSCOPY WITH PROPOFOL (N/A )  Patient Location: PACU  Anesthesia Type: General  Level of Consciousness: awake, alert  and patient cooperative  Airway and Oxygen Therapy: Patient Spontanous Breathing and Patient connected to supplemental oxygen  Post-op Assessment: Post-op Vital signs reviewed, Patient's Cardiovascular Status Stable, Respiratory Function Stable, Patent Airway and No signs of Nausea or vomiting  Post-op Vital Signs: Reviewed and stable  Complications: No apparent anesthesia complications

## 2016-11-11 NOTE — Anesthesia Preprocedure Evaluation (Signed)
Anesthesia Evaluation    Reviewed: Allergy & Precautions, Patient's Chart, lab work & pertinent test results  Airway Mallampati: II  TM Distance: >3 FB     Dental   Pulmonary sleep apnea and Continuous Positive Airway Pressure Ventilation , Current Smoker,           Cardiovascular hypertension,      Neuro/Psych  Neuromuscular disease    GI/Hepatic negative GI ROS, Neg liver ROS,   Endo/Other  negative endocrine ROS  Renal/GU negative Renal ROS     Musculoskeletal negative musculoskeletal ROS (+)   Abdominal   Peds  Hematology negative hematology ROS (+)   Anesthesia Other Findings Day of surgery medications reviewed with the patient.  Reproductive/Obstetrics                             Anesthesia Physical Anesthesia Plan  ASA: III  Anesthesia Plan: General   Post-op Pain Management:    Induction:   PONV Risk Score and Plan: 0 and Ondansetron, Dexamethasone and Treatment may vary due to age  Airway Management Planned:   Additional Equipment:   Intra-op Plan:   Post-operative Plan:   Informed Consent:   Plan Discussed with: CRNA  Anesthesia Plan Comments:         Anesthesia Quick Evaluation

## 2016-11-11 NOTE — Anesthesia Postprocedure Evaluation (Signed)
Anesthesia Post Note  Patient: Isaac Delacruz  Procedure(s) Performed: COLONOSCOPY WITH PROPOFOL (N/A )  Patient location during evaluation: PACU Anesthesia Type: General Level of consciousness: awake and alert Pain management: pain level controlled Vital Signs Assessment: post-procedure vital signs reviewed and stable Respiratory status: spontaneous breathing, nonlabored ventilation and respiratory function stable Cardiovascular status: blood pressure returned to baseline and stable Postop Assessment: no apparent nausea or vomiting Anesthetic complications: no    DANIEL D KOVACS

## 2016-11-11 NOTE — Op Note (Signed)
[facility] Patient Name: Isaac Delacruz Procedure Date: 11/11/2016 7:25 AM MRN: [id number] Account #: [id number] Date of Birth: 1965-04-11 Admit Type: Outpatient Age: 51 Room: [facility] OR ROOM 01 Gender: Male Note Status: Finalized Procedure:            Colonoscopy Indications:          Weight loss Providers:            Lin Landsman MD, MD Referring MD:         Bethena Roys. Sowles, MD (Referring MD) Medicines:            Monitored Anesthesia Care Complications:        No immediate complications. Estimated blood loss: None. Procedure:            Pre-Anesthesia Assessment:                       - Prior to the procedure, a History and Physical was                        performed, and patient medications and allergies were                        reviewed. The patient is competent. The risks and                        benefits of the procedure and the sedation options and                        risks were discussed with the patient. All questions                        were answered and informed consent was obtained.                        Patient identification and proposed procedure were                        verified by the physician, the nurse, the                        anesthesiologist, the anesthetist and the technician in                        the pre-procedure area in the procedure room. Mental                        Status Examination: alert and oriented. Airway                        Examination: normal oropharyngeal airway and neck                        mobility. Respiratory Examination: clear to                        auscultation. CV Examination: normal. Prophylactic                        Antibiotics: The patient does not require prophylactic  antibiotics. Prior Anticoagulants: The patient has                        taken no previous anticoagulant or antiplatelet agents.                        ASA Grade  Assessment: II - A patient with mild systemic                        disease. After reviewing the risks and benefits, the                        patient was deemed in satisfactory condition to undergo                        the procedure. The anesthesia plan was to use monitored                        anesthesia care (MAC). Immediately prior to                        administration of medications, the patient was                        re-assessed for adequacy to receive sedatives. The                        heart rate, respiratory rate, oxygen saturations, blood                        pressure, adequacy of pulmonary ventilation, and                        response to care were monitored throughout the                        procedure. The physical status of the patient was                        re-assessed after the procedure.                       After obtaining informed consent, the colonoscope was                        passed under direct vision. Throughout the procedure,                        the patient's blood pressure, pulse, and oxygen                        saturations were monitored continuously. The Olympus                        Colonoscope 190 [id number]) was introduced through the                        anus and advanced to the the terminal ileum. The  colonoscopy was performed without difficulty. The                        patient tolerated the procedure well. The quality of                        the bowel preparation was evaluated using the BBPS                        [facility] Bowel Preparation Scale) with scores of: Right                        Colon = 3, Transverse Colon = 3 and Left Colon = 3                        (entire mucosa seen well with no residual staining,                        small fragments of stool or opaque liquid). The total                        BBPS score equals 9. Findings:      The perianal and digital rectal examinations  were normal. Pertinent       negatives include normal sphincter tone and no palpable rectal lesions.      The terminal ileum appeared normal.      The colon (entire examined portion) appeared normal.      The retroflexed view of the distal rectum and anal verge showed       prominent rectal veins, otherwise was normal and showed no anal or       rectal abnormalities. Impression:           - The examined portion of the ileum was normal.                       - The entire examined colon is normal.                       - The distal rectum and anal verge are normal on                        retroflexion view.                       - No specimens collected. Recommendation:       - Discharge patient to home.                       - Resume previous diet today.                       - Continue present medications.                       - Repeat colonoscopy in 10 years for surveillance. Procedure Code(s):    --- Professional ---                       [phone], Colonoscopy, flexible; diagnostic, including  collection of specimen(s) by brushing or washing, when                        performed (separate procedure) Diagnosis Code(s):    --- Professional ---                       R63.4, Abnormal weight loss CPT copyright 2016 American Medical Association. All rights reserved. The codes documented in this report are preliminary and upon coder review may  be revised to meet current compliance requirements. Dr. Ulyess Mort Lin Landsman MD, MD 11/11/2016 8:41:06 AM This report has been signed electronically. Number of Addenda: 0 Note Initiated On: 11/11/2016 7:25 AM Scope Withdrawal Time: 0 hours 11 minutes 21 seconds  Total Procedure Duration: 0 hours 18 minutes 53 seconds       [facility]

## 2016-11-11 NOTE — Anesthesia Procedure Notes (Signed)
Procedure Name: MAC Date/Time: 11/11/2016 8:06 AM Performed by: Janna Arch Pre-anesthesia Checklist: Patient identified, Emergency Drugs available, Suction available and Patient being monitored Patient Re-evaluated:Patient Re-evaluated prior to induction Oxygen Delivery Method: Nasal cannula

## 2016-11-12 ENCOUNTER — Encounter: Payer: Self-pay | Admitting: Gastroenterology

## 2016-12-02 ENCOUNTER — Other Ambulatory Visit: Payer: Self-pay | Admitting: Family Medicine

## 2016-12-02 DIAGNOSIS — L239 Allergic contact dermatitis, unspecified cause: Secondary | ICD-10-CM

## 2016-12-02 DIAGNOSIS — L739 Follicular disorder, unspecified: Secondary | ICD-10-CM

## 2016-12-02 NOTE — Telephone Encounter (Signed)
Refill request for general medication: Minocycline and Hydroxyzine  Last office visit: 10/12/2016  Last physical exam: 09/09/2016  Follow up: 01/11/2017

## 2017-01-11 ENCOUNTER — Ambulatory Visit: Payer: BLUE CROSS/BLUE SHIELD | Admitting: Family Medicine

## 2017-01-16 ENCOUNTER — Encounter: Payer: Self-pay | Admitting: Family Medicine

## 2017-01-16 ENCOUNTER — Ambulatory Visit: Payer: BLUE CROSS/BLUE SHIELD | Admitting: Family Medicine

## 2017-01-16 VITALS — BP 130/80 | HR 71 | Resp 14 | Ht 68.0 in | Wt 249.7 lb

## 2017-01-16 DIAGNOSIS — E785 Hyperlipidemia, unspecified: Secondary | ICD-10-CM

## 2017-01-16 DIAGNOSIS — L739 Follicular disorder, unspecified: Secondary | ICD-10-CM | POA: Diagnosis not present

## 2017-01-16 DIAGNOSIS — S76319A Strain of muscle, fascia and tendon of the posterior muscle group at thigh level, unspecified thigh, initial encounter: Secondary | ICD-10-CM | POA: Diagnosis not present

## 2017-01-16 DIAGNOSIS — E8881 Metabolic syndrome: Secondary | ICD-10-CM | POA: Diagnosis not present

## 2017-01-16 DIAGNOSIS — I10 Essential (primary) hypertension: Secondary | ICD-10-CM

## 2017-01-16 DIAGNOSIS — G4733 Obstructive sleep apnea (adult) (pediatric): Secondary | ICD-10-CM

## 2017-01-16 DIAGNOSIS — E559 Vitamin D deficiency, unspecified: Secondary | ICD-10-CM

## 2017-01-16 LAB — POCT GLYCOSYLATED HEMOGLOBIN (HGB A1C): HEMOGLOBIN A1C: 5.6

## 2017-01-16 MED ORDER — METFORMIN HCL 500 MG PO TABS: 500.0000 mg | ORAL_TABLET | Freq: Every day | ORAL | 1 refills | Status: DC

## 2017-01-16 MED ORDER — ATORVASTATIN CALCIUM 40 MG PO TABS: 40.0000 mg | ORAL_TABLET | Freq: Every day | ORAL | 1 refills | Status: DC

## 2017-01-16 MED ORDER — LISINOPRIL 20 MG PO TABS: 20.0000 mg | ORAL_TABLET | Freq: Every day | ORAL | 1 refills | Status: DC

## 2017-01-16 NOTE — Patient Instructions (Signed)
Hamstring Strain A hamstring strain is an injury that occurs when the hamstring muscles are overstretched or overloaded. The hamstring muscles are a group of muscles at the back of the thighs. These muscles are used in straightening the hips, bending the knees, and pulling back the legs. This type of injury is often called a pulled hamstring muscle. The severity of a muscle strain is rated in degrees. First-degree strains have the least amount of muscle fiber tearing and pain. Second-degree and third-degree strains have increasingly more tearing and pain. What are the causes? Hamstring strains occur when a sudden, violent force is placed on these muscles and stretches them too far. This often occurs during activities that involve running, jumping, kicking, or weight lifting. What increases the risk? Hamstring strains are especially common in athletes. Other things that can increase your risk for this injury include:  Having low strength, endurance, or flexibility of the hamstring muscles.  Performing high-impact physical activity.  Having poor physical fitness.  Having a previous leg injury.  Having fatigued muscles.  Older age.  What are the signs or symptoms?  Pain in the back of the thigh.  Bruising.  Swelling.  Muscle spasm.  Difficulty using the muscle because of pain or lack of normal function. For severe strains, you may have a popping or snapping feeling when the injury occurs. How is this diagnosed? Your health care provider will perform a physical exam and ask about your medical history. How is this treated? Often, the best treatment for a hamstring strain is protecting, resting, icing, applying compression, and elevating the injured area. This is referred to as the PRICE method of treatment. Your health care provider may also recommend medicines to help reduce pain or inflammation. Follow these instructions at home:  Use the PRICE method of treatment to promote muscle  healing during the first 2-3 days after your injury. The PRICE method involves: ? P-Protecting the muscle from being injured again. ? R-Restricting your activity and resting the injured body part. ? I-Icing your injury. To do this, put ice in a plastic bag. Place a towel between your skin and the bag. Then, apply the ice and leave it on for 20 minutes, 2-3 times per day. After the third day, switch to moist heat packs. ? C-Applying compression to the injured area with an elastic bandage. Be careful not to wrap it too tightly. That may interfere with blood circulation or may increase swelling. ? E-Elevating the injured body part above the level of your heart as often as you can. You can do this by putting a pillow under your thigh when you sit or lie down.  Take medicines only as directed by your health care provider.  Begin exercising or stretching as directed by your health care provider.  Do not return to full activity level until your health care provider approves.  Keep all follow-up visits as directed by your health care provider. This is important. Contact a health care provider if:  You have increasing pain or swelling in the injured area.  You have numbness, tingling, or a significant loss of strength in the injured area.  Your foot or your toes become cold or turn blue. This information is not intended to replace advice given to you by your health care provider. Make sure you discuss any questions you have with your health care provider. Document Released: 10/14/2000 Document Revised: 06/27/2015 Document Reviewed: 09/04/2013 [employer] Interactive Patient Education  2018 [employer]  

## 2017-01-16 NOTE — Progress Notes (Signed)
Name: Isaac Delacruz   MRN: [id number]    DOB: October 20, 1965   Date:01/16/2017       Progress Note  Subjective  Chief Complaint  Chief Complaint  Patient presents with  . Hypertension  . Weight Check  . Prediabetes    HPI  HTN: taking medication as prescribed. No chest pain, SOBor palpitation.   Metabolic Syndrome: he denies polydipsia, polyuria or polyphagia. Taking Metformin and denies diarrhea associated with it.  Vitamin D Deficiency: he is taking otc supplementation, also discussed food with high vitamin D  Hyperlipidemia:  He states he has been taking statin daily, no myalgias, no chest pain   Tobacco use:  denies daily cough, no SOB, there is a family history of lung cancer,he is avoiding cigarettes, and smoking cigars once a day. Trying to quit.   Allergic Dermatitis: he scratches because he itches, he states it is a habit now, worse at night, not during work hours. Doing well with Atarax at night  OSA: he wears CPAP every night, very seldom wakes up with a headache, he denies day time fatigue. He feels better when he wears it, and does not snore while wearing it  Obesity: he has lost 25 lbs since last year without trying and 15 lbs from May to 09/2016. Weight is trending up again, he states he thinks it was because he had two jobs at the time, but quit the second job and is gaining weight again. Colonoscopy and labs were normal.   Hamstring strain: he has noticed tightness on both hamstrings when he first stands up also painful when he touches the area, denies back pain, normal rom of motion of spine, he has a physical job. Symptoms started a few weeks ago and does not recall any trauma  Patient Active Problem List   Diagnosis Date Noted  . Loss of weight   . Seasonal allergic rhinitis 08/18/2016  . Folliculitis 05/23/2015  . Allergic dermatitis 05/23/2015  . History of Bell's palsy 07/18/2014  . Benign essential HTN 07/18/2014  . Dyslipidemia 07/18/2014  . LBP  (low back pain) 07/18/2014  . Vitamin D deficiency 07/18/2014  . Dysmetabolic syndrome 07/18/2014  . Microalbuminuria 07/18/2014  . Extreme obesity 07/18/2014  . Obstructive apnea 07/18/2014  . Tobacco use 07/18/2014    Past Surgical History:  Procedure Laterality Date  . COLONOSCOPY    . COLONOSCOPY WITH PROPOFOL N/A 11/11/2016   Procedure: COLONOSCOPY WITH PROPOFOL;  Surgeon: Toney Reil, MD;  Location: [facility];  Service: Endoscopy;  Laterality: N/A;  sleep apnea    Family History  Problem Relation Age of Onset  . Cancer Mother 73       Lung  . Cancer Father        Prostate  . Diabetes Father   . Hypertension Brother   . Cancer Maternal Aunt        Lung  . Heart disease Brother     Social History   Socioeconomic History  . Marital status: Married    Spouse name: Not on file  . Number of children: Not on file  . Years of education: Not on file  . Highest education level: Not on file  Social Needs  . Financial resource strain: Not on file  . Food insecurity - worry: Not on file  . Food insecurity - inability: Not on file  . Transportation needs - medical: Not on file  . Transportation needs - non-medical: Not on file  Occupational History  .  Not on file  Tobacco Use  . Smoking status: Current Every Day Smoker    Packs/day: 0.00    Years: 19.00    Pack years: 0.00    Types: Cigars    Start date: 10/12/1997  . Smokeless tobacco: Never Used  . Tobacco comment: 2-3 cigars/day  Substance and Sexual Activity  . Alcohol use: Yes    Alcohol/week: 0.0 oz    Comment: rarely  . Drug use: No  . Sexual activity: Yes    Partners: Female  Other Topics Concern  . Not on file  Social History Narrative   Works full time at [employer]   Lives with fiancee and they have 3 children together     Current Outpatient Medications:  .  acetaminophen (TYLENOL 8 HOUR) 650 MG CR tablet, Take by mouth., Disp: , Rfl:  .  aspirin 81 MG tablet, Take  by mouth., Disp: , Rfl:  .  atorvastatin (LIPITOR) 40 MG tablet, Take 1 tablet (40 mg total) by mouth daily at 6 PM., Disp: 90 tablet, Rfl: 1 .  Cholecalciferol (VITAMIN D HIGH POTENCY) 1000 units capsule, Take by mouth., Disp: , Rfl:  .  fluticasone (FLONASE) 50 MCG/ACT nasal spray, INHALE 2 SPRAYS IN EACH NOSTRIL ONCE DAILY, Disp: 16 g, Rfl: 2 .  hydrOXYzine (ATARAX/VISTARIL) 50 MG tablet, Take 1 tablet (50 mg total) by mouth at bedtime as needed for itching., Disp: 90 tablet, Rfl: 1 .  lisinopril (PRINIVIL,ZESTRIL) 20 MG tablet, Take 1 tablet (20 mg total) by mouth daily., Disp: 90 tablet, Rfl: 1 .  loratadine (CLARITIN) 10 MG tablet, TAKE ONE TABLET BY MOUTH EVERY DAY, Disp: 90 tablet, Rfl: 1 .  metFORMIN (GLUCOPHAGE) 500 MG tablet, Take 1 tablet (500 mg total) by mouth daily with breakfast., Disp: 90 tablet, Rfl: 1 .  minocycline (MINOCIN,DYNACIN) 100 MG capsule, Take 1 capsule (100 mg total) by mouth daily., Disp: 90 capsule, Rfl: 1 .  Vitamin D, Ergocalciferol, (DRISDOL) 50000 units CAPS capsule, Take 1 capsule (50,000 Units total) by mouth every 7 (seven) days., Disp: 12 capsule, Rfl: 0  No Known Allergies   ROS  Constitutional: Negative for fever , positive for  weight change.  Respiratory: Negative for cough and shortness of breath.   Cardiovascular: Negative for chest pain or palpitations.  Gastrointestinal: Negative for abdominal pain, no bowel changes.  Musculoskeletal: Negative for gait problem or joint swelling.  Skin: Negative for rash.  Neurological: Negative for dizziness or headache.  No other specific complaints in a complete review of systems (except as listed in HPI above).  Objective  Vitals:   01/16/17 0953  BP: 130/80  Pulse: 71  Resp: 14  SpO2: 98%  Weight: 249 lb 11.2 oz (113.3 kg)  Height: 5\' 8"  (1.727 m)    Body mass index is 37.97 kg/m.  Physical Exam  Constitutional: Patient appears well-developed and well-nourished. Obese  No distress.   HEENT: head atraumatic, normocephalic, pupils equal and reactive to light, neck supple, throat within normal limits Cardiovascular: Normal rate, regular rhythm and normal heart sounds.  No murmur heard. No BLE edema. Pulmonary/Chest: Effort normal and breath sounds normal. No respiratory distress. Abdominal: Soft.  There is no tenderness. Psychiatric: Patient has a normal mood and affect. behavior is normal. Judgment and thought content normal. Muscular Skeletal: pain during palpation of hamstring, mid thigh bilaterally. Normal rom of back   Recent Results (from the past 2160 hour(s))  Glucose, capillary     Status: None  Collection Time: 11/11/16  6:48 AM  Result Value Ref Range   Glucose-Capillary 94 65 - 99 mg/dL      WUJ8/1PHQ2/9: Depression screen [facility] 09/09/2016 11/22/2015 05/23/2015  Decreased Interest 0 0 0  Down, Depressed, Hopeless 0 0 0  PHQ - 2 Score 0 0 0    Fall Risk: Fall Risk  09/09/2016 11/22/2015 05/23/2015  Falls in the past year? No No No     Functional Status Survey: Is the patient deaf or have difficulty hearing?: No Does the patient have difficulty seeing, even when wearing glasses/contacts?: No Does the patient have difficulty concentrating, remembering, or making decisions?: No Does the patient have difficulty walking or climbing stairs?: No Does the patient have difficulty dressing or bathing?: No Does the patient have difficulty doing errands alone such as visiting a doctor's office or shopping?: No   Assessment & Plan  1. Dysmetabolic syndrome  - POCT HgB [id number] - metFORMIN (GLUCOPHAGE) 500 MG tablet; Take 1 tablet (500 mg total) by mouth daily with breakfast.  Dispense: 90 tablet; Refill: 1  2. Dyslipidemia  - atorvastatin (LIPITOR) 40 MG tablet; Take 1 tablet (40 mg total) by mouth daily at 6 PM.  Dispense: 90 tablet; Refill: 1  3. Benign essential HTN  - lisinopril (PRINIVIL,ZESTRIL) 20 MG tablet; Take 1 tablet (20 mg total) by mouth daily.   Dispense: 90 tablet; Refill: 1  4. Obstructive apnea  Continue CPAP use   5. Morbid obesity, unspecified obesity type (HCC)  Life style modification  6. Vitamin D deficiency  Switch to otc supplementation after finished with rx vitamin D   7. Folliculitis  Continue medication   8. Hamstring muscle strain, unspecified laterality, initial encounter  Advised foam rolling, stretching and quad exercises

## 2017-07-16 ENCOUNTER — Ambulatory Visit: Payer: BLUE CROSS/BLUE SHIELD | Admitting: Nurse Practitioner

## 2017-09-22 ENCOUNTER — Ambulatory Visit: Payer: BLUE CROSS/BLUE SHIELD | Admitting: Family Medicine

## 2018-08-23 ENCOUNTER — Encounter: Payer: Self-pay | Admitting: Family Medicine

## 2018-08-23 ENCOUNTER — Ambulatory Visit (INDEPENDENT_AMBULATORY_CARE_PROVIDER_SITE_OTHER): Payer: 59 | Admitting: Family Medicine

## 2018-08-23 ENCOUNTER — Other Ambulatory Visit: Payer: Self-pay

## 2018-08-23 DIAGNOSIS — R221 Localized swelling, mass and lump, neck: Secondary | ICD-10-CM

## 2018-08-23 DIAGNOSIS — E785 Hyperlipidemia, unspecified: Secondary | ICD-10-CM

## 2018-08-23 DIAGNOSIS — J302 Other seasonal allergic rhinitis: Secondary | ICD-10-CM | POA: Diagnosis not present

## 2018-08-23 DIAGNOSIS — Z125 Encounter for screening for malignant neoplasm of prostate: Secondary | ICD-10-CM

## 2018-08-23 DIAGNOSIS — L239 Allergic contact dermatitis, unspecified cause: Secondary | ICD-10-CM | POA: Diagnosis not present

## 2018-08-23 DIAGNOSIS — Z23 Encounter for immunization: Secondary | ICD-10-CM

## 2018-08-23 DIAGNOSIS — R809 Proteinuria, unspecified: Secondary | ICD-10-CM

## 2018-08-23 DIAGNOSIS — G4733 Obstructive sleep apnea (adult) (pediatric): Secondary | ICD-10-CM

## 2018-08-23 DIAGNOSIS — I10 Essential (primary) hypertension: Secondary | ICD-10-CM

## 2018-08-23 DIAGNOSIS — H6983 Other specified disorders of Eustachian tube, bilateral: Secondary | ICD-10-CM

## 2018-08-23 DIAGNOSIS — Z0000 Encounter for general adult medical examination without abnormal findings: Secondary | ICD-10-CM

## 2018-08-23 DIAGNOSIS — E8881 Metabolic syndrome: Secondary | ICD-10-CM

## 2018-08-23 DIAGNOSIS — E559 Vitamin D deficiency, unspecified: Secondary | ICD-10-CM

## 2018-08-23 MED ORDER — HYDROXYZINE HCL 25 MG PO TABS: 25.0000 mg | ORAL_TABLET | Freq: Two times a day (BID) | ORAL | 2 refills | Status: DC | PRN

## 2018-08-23 MED ORDER — LORATADINE 10 MG PO TABS: 10.0000 mg | ORAL_TABLET | Freq: Every day | ORAL | 2 refills | Status: DC

## 2018-08-23 MED ORDER — FLUTICASONE PROPIONATE 50 MCG/ACT NA SUSP: 2.0000 | Freq: Every day | NASAL | 2 refills | Status: DC

## 2018-08-23 NOTE — Progress Notes (Signed)
Name: Isaac SchilderJimmy L Delacruz   MRN: [id number]    DOB: Aug 22, 1965   Date:08/23/2018       Progress Note  Subjective  Chief Complaint  Chief Complaint  Patient presents with  . Annual Exam    HPI  Patient presents for annual CPE and follow up  Obesity: he is eating fast food on week days, not exercise outside work, BMI above 40 now and discussed importance of weight loss  HTN: he ran out of insurance and lost to follow up, he has been out of lisinopril for over one year, bp is at goal, we will monitor and recheck labs since he has a history of microalbuminuria  Dyslipidemia: he used to take Lipitor but ran out of medication, we will recheck labs  Pre-diabetes: he has a BMI above 40 now, his A1C was 5.7% in the past, he denies polyphagia, polydipsia or polyuria. He used to take Metformin but we will recheck labs before resuming medicaiton  Dermatitis: He goes to [place], not taking antibiotics, he states skin itches a lot a night and would like to resume loratadine and hydroxyzine   USPSTF grade A and B recommendations:  Diet: poor Exercise: need to improve   Depression: phq 9 is negative [facility] screen [facility] 08/23/2018 09/09/2016 11/22/2015 05/23/2015  Decreased Interest 0 0 0 0  Down, Depressed, Hopeless 0 0 0 0  PHQ - 2 Score 0 0 0 0  Altered sleeping 0 - - -  Tired, decreased energy 0 - - -  Change in appetite 0 - - -  Feeling bad or failure about yourself  0 - - -  Trouble concentrating 0 - - -  Moving slowly or fidgety/restless 0 - - -  Suicidal thoughts 0 - - -  PHQ-9 Score 0 - - -    Hypertension:  BP Readings from Last 3 Encounters:  08/23/18 130/72  01/16/17 130/80  11/11/16 (!) 127/91    Obesity: Wt Readings from Last 3 Encounters:  08/23/18 258 lb 11.2 oz (117.3 kg)  01/16/17 249 lb 11.2 oz (113.3 kg)  11/11/16 226 lb (102.5 kg)   BMI Readings from Last 3 Encounters:  08/23/18 40.22 kg/m  01/16/17 37.97 kg/m  11/11/16 34.36 kg/m     Lipids:  Lab  Results  Component Value Date   CHOL 170 09/09/2016   CHOL 194 05/23/2015   CHOL 200 10/02/2013   Lab Results  Component Value Date   HDL 36 (L) 09/09/2016   HDL 31 (L) 05/23/2015   HDL 37 10/02/2013   Lab Results  Component Value Date   LDLCALC 115 (H) 09/09/2016   LDLCALC 135 (H) 05/23/2015   LDLCALC 134 10/02/2013   Lab Results  Component Value Date   TRIG 96 09/09/2016   TRIG 140 05/23/2015   TRIG 143 10/02/2013   Lab Results  Component Value Date   CHOLHDL 4.7 09/09/2016   CHOLHDL 6.3 (H) 05/23/2015   No results found for: LDLDIRECT Glucose:  Glucose  Date Value Ref Range Status  05/23/2015 92 65 - 99 mg/dL Final   Glucose, Bld  Date Value Ref Range Status  09/09/2016 92 65 - 99 mg/dL Final   Glucose-Capillary  Date Value Ref Range Status  11/11/2016 94 65 - 99 mg/dL Final      [facility]  AUDIT-C Score  0      Married STD testing and prevention (HIV/chl/gon/syphilis):  Hep C: negative 2018  Skin cancer: discussed atypical lesions  Colorectal cancer: repeat in 2028 Prostate cancer: discussed USPTF  Lab Results  Component Value Date   PSA 0.8 09/09/2016    IPSS Questionnaire (AUA-7): Over the past month.   1)  How often have you had a sensation of not emptying your bladder completely after you finish urinating?  0 - Not at all  2)  How often have you had to urinate again less than two hours after you finished urinating? 0 - Not at all  3)  How often have you found you stopped and started again several times when you urinated?  0 - Not at all  4) How difficult have you found it to postpone urination?  0 - Not at all  5) How often have you had a weak urinary stream?  0 - Not at all  6) How often have you had to push or strain to begin urination?  0 - Not at all  7) How many times did you most typically get up to urinate from the time you went to bed until the time you got up in the morning?  2  - 2 times  Total score:  0-7 mildly symptomatic   8-19 moderately symptomatic   20-35 severely symptomatic    Lung cancer:   Low Dose CT Chest recommended if Age 21-80 years, 30 pack-year currently smoking OR have quit w/in 15years. Patient does not qualify.   AAA:  The USPSTF recommends one-time screening with ultrasonography in men ages 7665 to 4175 years who have ever smoked, we will order at 3365  ECG:  2017   Advanced Care Planning: A voluntary discussion about advance care planning including the explanation and discussion of advance directives.  Discussed health care proxy and Living will, and the patient was able to identify a health care proxy as wife   Patient does not have a living will at present time.  Patient Active Problem List   Diagnosis Date Noted  . Seasonal allergic rhinitis 08/18/2016  . Folliculitis 05/23/2015  . Allergic dermatitis 05/23/2015  . History of Bell's palsy 07/18/2014  . Benign essential HTN 07/18/2014  . Dyslipidemia 07/18/2014  . LBP (low back pain) 07/18/2014  . Vitamin D deficiency 07/18/2014  . Dysmetabolic syndrome 07/18/2014  . Microalbuminuria 07/18/2014  . Extreme obesity 07/18/2014  . Obstructive apnea 07/18/2014  . Tobacco use 07/18/2014    Past Surgical History:  Procedure Laterality Date  . COLONOSCOPY    . COLONOSCOPY WITH PROPOFOL N/A 11/11/2016   Procedure: COLONOSCOPY WITH PROPOFOL;  Surgeon: Toney ReilVanga, Rohini Reddy, MD;  [facility];  Service: Endoscopy;  Laterality: N/A;  sleep apnea    Family History  Problem Relation Age of Onset  . Cancer Mother 8984       Lung  . Cancer Father        Prostate  . Diabetes Father   . Hypertension Brother   . Cancer Maternal Aunt        Lung  . Heart disease Brother     Social History   Socioeconomic History  . Marital status: Married    Spouse name: Not on file  . Number of children: Not on file  . Years of education: Not on file  . Highest education level: Not on  file  Occupational History  . Not on file  Social Needs  . Financial resource strain: Not on file  . Food insecurity    Worry: Not on file  Inability: Not on file  . Transportation needs    Medical: Not on file    Non-medical: Not on file  Tobacco Use  . Smoking status: Current Every Day Smoker    Years: 22.00    Types: Cigars    Start date: 10/12/1997  . Smokeless tobacco: Never Used  Substance and Sexual Activity  . Alcohol use: Yes    Alcohol/week: 0.0 standard drinks    Comment: rarely  . Drug use: No  . Sexual activity: Yes    Partners: Female  Lifestyle  . Physical activity    Days per week: 0 days    Minutes per session: 0 min  . Stress: Not at all  Relationships  . Social connections    Talks on phone: More than three times a week    Gets together: More than three times a week    Attends religious service: More than 4 times per year    Active member of club or organization: Yes    Attends meetings of clubs or organizations: Never    Relationship status: Married  . Intimate partner violence    Fear of current or ex partner: No    Emotionally abused: No    Physically abused: No    Forced sexual activity: No  Other Topics Concern  . Not on file  Social History Narrative   Works full time at [employer] in the [employer] room    Lives with wife  and they have 3 children together     Current Outpatient Medications:  .  aspirin 81 MG tablet, Take by mouth., Disp: , Rfl:  .  Cholecalciferol (VITAMIN D HIGH POTENCY) 1000 units capsule, Take by mouth., Disp: , Rfl:  .  fluticasone (FLONASE) 50 MCG/ACT nasal spray, Place 2 sprays into both nostrils daily., Disp: 16 g, Rfl: 2 .  loratadine (CLARITIN) 10 MG tablet, Take 1 tablet (10 mg total) by mouth daily., Disp: 30 tablet, Rfl: 2 .  hydrOXYzine (ATARAX/VISTARIL) 25 MG tablet, Take 1 tablet (25 mg total) by mouth 2 (two) times daily as needed., Disp: 60 tablet, Rfl: 2  No Known Allergies   ROS  Constitutional:  Negative for fever, positive for weight change.  Respiratory: Negative for cough and shortness of breath.   Cardiovascular: Negative for chest pain or palpitations.  Gastrointestinal: Negative for abdominal pain, no bowel changes.  Musculoskeletal: Negative for gait problem or joint swelling.  Skin: positive  for chronic rash.  Neurological: Negative for dizziness or headache.  No other specific complaints in a complete review of systems (except as listed in HPI above).   Objective  Vitals:   08/23/18 0810  BP: 130/72  Pulse: 97  Resp: 16  Temp: 97.7 F (36.5 C)  TempSrc: Temporal  SpO2: 99%  Weight: 258 lb 11.2 oz (117.3 kg)  Height: 5' 7.25" (1.708 m)    Body mass index is 40.22 kg/m.  Physical Exam  Constitutional: Patient appears well-developed and well-nourished. No distress.  HENT: Head: Normocephalic and atraumatic. Ears: B TMs ok, no erythema or effusion; Nose: Nose normal. Mouth/Throat: Oropharynx is clear and moist. No oropharyngeal exudate. Right side of neck has a non tender mass oval in size  Eyes: Conjunctivae and EOM are normal. Pupils are equal, round, and reactive to light. No scleral icterus.  Neck: Normal range of motion. Neck supple. No JVD present. No thyromegaly present.  Cardiovascular: Normal rate, regular rhythm and normal heart sounds.  No murmur heard. No BLE edema.  Pulmonary/Chest: Effort normal and breath sounds normal. No respiratory distress. Abdominal: Soft. Bowel sounds are normal, no distension. There is no tenderness. no masses MALE GENITALIA: Normal descended testes bilaterally, no masses palpated, no hernias, no lesions, no discharge RECTAL: Prostate normal size and consistency, no rectal masses or hemorrhoids Musculoskeletal: Normal range of motion, no joint effusions. No gross deformities Neurological: he is alert and oriented to person, place, and time. No cranial nerve deficit. Coordination, balance, strength, speech and gait are  normal.  Skin: Skin is warm and dry. No rash noted. No erythema.  Psychiatric: Patient has a normal mood and affect. behavior is normal. Judgment and thought content normal.   Fall Risk: Fall Risk  08/23/2018 09/09/2016 11/22/2015 05/23/2015  Falls in the past year? 0 No No No  Number falls in past yr: 0 - - -  Injury with Fall? 0 - - -    Functional Status Survey: Is the patient deaf or have difficulty hearing?: No Does the patient have difficulty seeing, even when wearing glasses/contacts?: No Does the patient have difficulty concentrating, remembering, or making decisions?: No Does the patient have difficulty walking or climbing stairs?: No Does the patient have difficulty dressing or bathing?: No Does the patient have difficulty doing errands alone such as visiting a doctor's office or shopping?: No    Assessment & Plan  1. Morbid obesity, unspecified obesity type [facility])  Discussed with the patient the risk posed by an increased BMI. Discussed importance of portion control, calorie counting and at least 150 minutes of physical activity weekly. Avoid sweet beverages and drink more water. Eat at least 6 servings of fruit and vegetables daily   2. Need for Tdap vaccination  - Tdap vaccine greater than or equal to 7yo IM  3. Allergic dermatitis  - loratadine (CLARITIN) 10 MG tablet; Take 1 tablet (10 mg total) by mouth daily.  Dispense: 30 tablet; Refill: 2 - hydrOXYzine (ATARAX/VISTARIL) 25 MG tablet; Take 1 tablet (25 mg total) by mouth 2 (two) times daily as needed.  Dispense: 60 tablet; Refill: 2  4. Seasonal allergic rhinitis, unspecified trigger  - fluticasone (FLONASE) 50 MCG/ACT nasal spray; Place 2 sprays into both nostrils daily.  Dispense: 16 g; Refill: 2  5. Eustachian tube dysfunction, bilateral  Uses nasal spray   6. Encounter for routine history and physical exam for male  - COMPLETE METABOLIC PANEL WITH GFR - CBC with Differential/Platelet - Hemoglobin  A1c - Lipid panel - VITAMIN D 25 Hydroxy (Vit-D Deficiency, Fractures) - Microalbumin / creatinine urine ratio - Insulin, Free (Bioactive)  7. Vitamin D deficiency  - VITAMIN D 25 Hydroxy (Vit-D Deficiency, Fractures)  8. Obstructive apnea   9. HTN  - COMPLETE METABOLIC PANEL WITH GFR - CBC with Differential/Platelet - Microalbumin / creatinine urine ratio  10. Dyslipidemia  - Lipid panel  11. Dysmetabolic syndrome  - Hemoglobin A1c - Insulin, Free (Bioactive)  12. Microalbuminuria  - Microalbumin / creatinine urine ratio  13. Neck mass  - US SOFT TISSUE HEAD & NECK (NON-THYROID); Future  14. Prostate cancer screening  - PSA  -Prostate cancer screening and PSA options (with potential risks and benefits of testing vs not testing) were discussed along with recent recs/guidelines. -USPSTF grade A and B recommendations reviewed with patient; age-appropriate recommendations, preventive care, screening tests, etc discussed and encouraged; healthy living encouraged; see AVS for patient education given to patient -Discussed importance of 150 minutes of physical activity weekly, eat two servings of fish weekly, eat  one serving of tree nuts ( cashews, pistachios, pecans, almonds.Marland Kitchen) every other day, eat 6 servings of fruit/vegetables daily and drink plenty of water and avoid sweet beverages.

## 2018-08-23 NOTE — Patient Instructions (Signed)
Preventive Care 40-51 Years Old, Male Preventive care refers to lifestyle choices and visits with your health care provider that can promote health and wellness. This includes:  A yearly physical exam. This is also called an annual well check.  Regular dental and eye exams.  Immunizations.  Screening for certain conditions.  Healthy lifestyle choices, such as eating a healthy diet, getting regular exercise, not using drugs or products that contain nicotine and tobacco, and limiting alcohol use. What can I expect for my preventive care visit? Physical exam Your health care provider will check:  Height and weight. These may be used to calculate body mass index (BMI), which is a measurement that tells if you are at a healthy weight.  Heart rate and blood pressure.  Your skin for abnormal spots. Counseling Your health care provider may ask you questions about:  Alcohol, tobacco, and drug use.  Emotional well-being.  Home and relationship well-being.  Sexual activity.  Eating habits.  Work and work environment. What immunizations do I need?  Influenza (flu) vaccine  This is recommended every year. Tetanus, diphtheria, and pertussis (Tdap) vaccine  You may need a Td booster every 10 years. Varicella (chickenpox) vaccine  You may need this vaccine if you have not already been vaccinated. Zoster (shingles) vaccine  You may need this after age 60. Measles, mumps, and rubella (MMR) vaccine  You may need at least one dose of MMR if you were born in 1957 or later. You may also need a second dose. Pneumococcal conjugate (PCV13) vaccine  You may need this if you have certain conditions and were not previously vaccinated. Pneumococcal polysaccharide (PPSV23) vaccine  You may need one or two doses if you smoke cigarettes or if you have certain conditions. Meningococcal conjugate (MenACWY) vaccine  You may need this if you have certain conditions. Hepatitis A vaccine   You may need this if you have certain conditions or if you travel or work in places where you may be exposed to hepatitis A. Hepatitis B vaccine  You may need this if you have certain conditions or if you travel or work in places where you may be exposed to hepatitis B. Haemophilus influenzae type b (Hib) vaccine  You may need this if you have certain risk factors. Human papillomavirus (HPV) vaccine  If recommended by your health care provider, you may need three doses over 6 months. You may receive vaccines as individual doses or as more than one vaccine together in one shot (combination vaccines). Talk with your health care provider about the risks and benefits of combination vaccines. What tests do I need? Blood tests  Lipid and cholesterol levels. These may be checked every 5 years, or more frequently if you are over 50 years old.  Hepatitis C test.  Hepatitis B test. Screening  Lung cancer screening. You may have this screening every year starting at age 55 if you have a 30-pack-year history of smoking and currently smoke or have quit within the past 15 years.  Prostate cancer screening. Recommendations will vary depending on your family history and other risks.  Colorectal cancer screening. All adults should have this screening starting at age 50 and continuing until age 75. Your health care provider may recommend screening at age 45 if you are at increased risk. You will have tests every 1-10 years, depending on your results and the type of screening test.  Diabetes screening. This is done by checking your blood sugar (glucose) after you have not eaten   for a while (fasting). You may have this done every 1-3 years.  Sexually transmitted disease (STD) testing. Follow these instructions at home: Eating and drinking  Eat a diet that includes fresh fruits and vegetables, whole grains, lean protein, and low-fat dairy products.  Take vitamin and mineral supplements as recommended  by your health care provider.  Do not drink alcohol if your health care provider tells you not to drink.  If you drink alcohol: ? Limit how much you have to 0-2 drinks a day. ? Be aware of how much alcohol is in your drink. In the [place] one drink equals one 12 oz bottle of beer (355 mL), one 5 oz glass of wine (148 mL), or one 1 oz glass of hard liquor (44 mL). Lifestyle  Take daily care of your teeth and gums.  Stay active. Exercise for at least 30 minutes on 5 or more days each week.  Do not use any products that contain nicotine or tobacco, such as cigarettes, e-cigarettes, and chewing tobacco. If you need help quitting, ask your health care provider.  If you are sexually active, practice safe sex. Use a condom or other form of protection to prevent STIs (sexually transmitted infections).  Talk with your health care provider about taking a low-dose aspirin every day starting at age 33. What's next?  Go to your health care provider once a year for a well check visit.  Ask your health care provider how often you should have your eyes and teeth checked.  Stay up to date on all vaccines. This information is not intended to replace advice given to you by your health care provider. Make sure you discuss any questions you have with your health care provider. Document Released: 02/15/2015 Document Revised: 01/13/2018 Document Reviewed: 01/13/2018 Elsevier Patient Education  2020 [employer].

## 2018-08-30 ENCOUNTER — Other Ambulatory Visit: Payer: Self-pay

## 2018-08-30 ENCOUNTER — Other Ambulatory Visit: Payer: Self-pay | Admitting: Family Medicine

## 2018-08-30 ENCOUNTER — Ambulatory Visit
Admission: RE | Admit: 2018-08-30 | Discharge: 2018-08-30 | Disposition: A | Discharge status: Home / Self Care | Payer: 59 | Source: Ambulatory Visit | Attending: Family Medicine | Admitting: Family Medicine

## 2018-08-30 DIAGNOSIS — R221 Localized swelling, mass and lump, neck: Secondary | ICD-10-CM | POA: Insufficient documentation

## 2018-08-30 MED ORDER — VITAMIN D (ERGOCALCIFEROL) 1.25 MG (50000 UNIT) PO CAPS: 50000.0000 [IU] | ORAL_CAPSULE | ORAL | 0 refills | Status: DC

## 2018-08-31 ENCOUNTER — Other Ambulatory Visit: Payer: Self-pay | Admitting: Family Medicine

## 2018-08-31 MED ORDER — ATORVASTATIN CALCIUM 40 MG PO TABS: 40.0000 mg | ORAL_TABLET | Freq: Every day | ORAL | 0 refills | Status: DC

## 2018-09-01 ENCOUNTER — Encounter: Payer: BLUE CROSS/BLUE SHIELD | Admitting: Family Medicine

## 2018-09-01 LAB — LIPID PANEL
Cholesterol: 221 mg/dL — ABNORMAL HIGH (ref <200)
HDL: 31 mg/dL — ABNORMAL LOW (ref >=40)
LDL Cholesterol (Calc): 156 mg/dL (calc) — ABNORMAL HIGH
Non-HDL Cholesterol (Calc): 190 mg/dL (calc) — ABNORMAL HIGH (ref <130)
Total CHOL/HDL Ratio: 7.1 (calc) — ABNORMAL HIGH (ref <5.0)
Triglycerides: 185 mg/dL — ABNORMAL HIGH (ref <150)

## 2018-09-01 LAB — COMPLETE METABOLIC PANEL WITH GFR
AG Ratio: 1.2 (calc) (ref 1.0–2.5)
ALT: 9 U/L (ref 9–46)
AST: 16 U/L (ref 10–35)
Albumin: 4.2 g/dL (ref 3.6–5.1)
Alkaline phosphatase (APISO): 58 U/L (ref 35–144)
BUN: 15 mg/dL (ref 7–25)
CO2: 26 mmol/L (ref 20–32)
Calcium: 9.4 mg/dL (ref 8.6–10.3)
Chloride: 104 mmol/L (ref 98–110)
Creat: 1.11 mg/dL (ref 0.70–1.33)
GFR, Est African American: 89 mL/min/{1.73_m2} (ref >=60)
GFR, Est Non African American: 76 mL/min/{1.73_m2} (ref >=60)
Globulin: 3.6 g/dL (calc) (ref 1.9–3.7)
Glucose, Bld: 97 mg/dL (ref 65–99)
Potassium: 4 mmol/L (ref 3.5–5.3)
Sodium: 138 mmol/L (ref 135–146)
Total Bilirubin: 0.6 mg/dL (ref 0.2–1.2)
Total Protein: 7.8 g/dL (ref 6.1–8.1)

## 2018-09-01 LAB — HEMOGLOBIN A1C
Hgb A1c MFr Bld: 5.9 % of total Hgb — ABNORMAL HIGH (ref <5.7)
Mean Plasma Glucose: 123 (calc)
eAG (mmol/L): 6.8 (calc)

## 2018-09-01 LAB — MICROALBUMIN / CREATININE URINE RATIO
Creatinine, Urine: 282 mg/dL (ref 20–320)
Microalb Creat Ratio: 4 mcg/mg creat (ref <30)
Microalb, Ur: 1 mg/dL

## 2018-09-01 LAB — CBC WITH DIFFERENTIAL/PLATELET
Absolute Monocytes: 746 cells/uL (ref 200–950)
Basophils Absolute: 59 cells/uL (ref 0–200)
Basophils Relative: 0.9 %
Eosinophils Absolute: 218 cells/uL (ref 15–500)
Eosinophils Relative: 3.3 %
HCT: 45.7 % (ref 38.5–50.0)
Hemoglobin: 15.3 g/dL (ref 13.2–17.1)
Lymphs Abs: 1617 cells/uL (ref 850–3900)
MCH: 29 pg (ref 27.0–33.0)
MCHC: 33.5 g/dL (ref 32.0–36.0)
MCV: 86.7 fL (ref 80.0–100.0)
MPV: 11.8 fL (ref 7.5–12.5)
Monocytes Relative: 11.3 %
Neutro Abs: 3960 cells/uL (ref 1500–7800)
Neutrophils Relative %: 60 %
Platelets: 207 10*3/uL (ref 140–400)
RBC: 5.27 10*6/uL (ref 4.20–5.80)
RDW: 14 % (ref 11.0–15.0)
Total Lymphocyte: 24.5 %
WBC: 6.6 10*3/uL (ref 3.8–10.8)

## 2018-09-01 LAB — INSULIN, FREE (BIOACTIVE): Insulin, Free: 7.5 u[IU]/mL (ref 1.5–14.9)

## 2018-09-01 LAB — PSA: PSA: 0.6 ng/mL (ref <=4.0)

## 2018-09-01 LAB — VITAMIN D 25 HYDROXY (VIT D DEFICIENCY, FRACTURES): Vit D, 25-Hydroxy: 13 ng/mL — ABNORMAL LOW (ref 30–100)

## 2018-09-02 ENCOUNTER — Other Ambulatory Visit: Payer: Self-pay | Admitting: Family Medicine

## 2018-09-02 DIAGNOSIS — R221 Localized swelling, mass and lump, neck: Secondary | ICD-10-CM

## 2018-09-15 ENCOUNTER — Ambulatory Visit: Payer: Self-pay | Admitting: General Surgery

## 2018-09-19 ENCOUNTER — Other Ambulatory Visit: Payer: Self-pay

## 2018-09-19 ENCOUNTER — Ambulatory Visit
Admission: RE | Admit: 2018-09-19 | Discharge: 2018-09-19 | Disposition: A | Discharge status: Home / Self Care | Payer: 59 | Source: Ambulatory Visit | Attending: Family Medicine | Admitting: Family Medicine

## 2018-09-19 DIAGNOSIS — R221 Localized swelling, mass and lump, neck: Secondary | ICD-10-CM | POA: Diagnosis not present

## 2018-09-19 MED ORDER — IOHEXOL 300 MG/ML  SOLN
75.0000 mL | Freq: Once | INTRAMUSCULAR | Status: AC | PRN
  Administered 2018-09-19: 75 mL via INTRAVENOUS

## 2018-09-22 ENCOUNTER — Ambulatory Visit: Payer: 59 | Admitting: General Surgery

## 2018-09-22 ENCOUNTER — Other Ambulatory Visit: Payer: Self-pay

## 2018-09-22 ENCOUNTER — Encounter: Payer: Self-pay | Admitting: General Surgery

## 2018-09-22 VITALS — BP 172/96 | HR 88 | Temp 98.4°F | Ht 64.0 in | Wt 262.2 lb

## 2018-09-22 DIAGNOSIS — R599 Enlarged lymph nodes, unspecified: Secondary | ICD-10-CM | POA: Diagnosis not present

## 2018-09-22 NOTE — Progress Notes (Signed)
Patient ID: Isaac SchilderJimmy L Delacruz, male   DOB: 04/13/1965, 53 y.o.   MRN: [id number]  Chief Complaint  Patient presents with  . Other    HPI Isaac Delacruz is a 53 y.o. male.   He presents today as a referral from his primary care provider, Dr. Alba CoryKrichna Sowles, for evaluation of a right sided lymph node.  [name] states that it has been present for at least a year.  He noticed it one day while washing his hair.  He says it is nontender and has never drained anything.  He reports that it has remained essentially identical in character since he first noticed it.  He recently saw his primary care doctor and brought it to her attention.  She had an ultrasound performed that determined that the node was not completely normal, so this study was followed with a CT scan.  He has been referred to determine whether or not surgical intervention is necessary.  He denies any fevers or loss of appetite.  No night sweats or unexpected weight loss.  He has not noticed any similar lumps or bumps in his axilla or groin.  He does have a history of eczema.  He denies any sinus infections or other upper respiratory symptoms.  No dental caries that he is aware of.  The CT scan did describe hypertrophic tonsils, but Isaac Delacruz denies any dysphagia or frequent throat infections.  He does smoke.   Past Medical History:  Diagnosis Date  . Bell's palsy   . Eczema   . Folliculitis    Dr. Lisabeth DevoidKenneth Becker in [place]  . Hyperlipidemia   . Hypertension   . Intermittent low back pain   . Low serum vitamin D   . Metabolic syndrome   . Microalbuminuria   . Morbid obesity (HCC)   . OSA on CPAP     Past Surgical History:  Procedure Laterality Date  . COLONOSCOPY    . COLONOSCOPY WITH PROPOFOL N/A 11/11/2016   Procedure: COLONOSCOPY WITH PROPOFOL;  Surgeon: Toney ReilVanga, Rohini Reddy, MD;  [facility];  Service: Endoscopy;  Laterality: N/A;  sleep apnea    Family History  Problem Relation Age of Onset  . Cancer Mother  [id number]       Lung  . Cancer Father        Prostate  . Diabetes Father   . Hypertension Brother   . Cancer Maternal Aunt        Lung  . Heart disease Brother     Social History Social History   Tobacco Use  . Smoking status: Current Some Day Smoker    Years: 22.00    Types: Cigars    Start date: 10/12/1997  . Smokeless tobacco: Never Used  Substance Use Topics  . Alcohol use: Yes    Alcohol/week: 0.0 standard drinks    Comment: rarely  . Drug use: No    No Known Allergies  Current Outpatient Medications  Medication Sig Dispense Refill  . aspirin 81 MG tablet Take by mouth.    Marland Kitchen. atorvastatin (LIPITOR) 40 MG tablet Take 1 tablet (40 mg total) by mouth daily. 90 tablet 0  . fluticasone (FLONASE) 50 MCG/ACT nasal spray Place 2 sprays into both nostrils daily. 16 g 2  . hydrOXYzine (ATARAX/VISTARIL) 25 MG tablet Take 1 tablet (25 mg total) by mouth 2 (two) times daily as needed. 60 tablet 2  . loratadine (CLARITIN) 10 MG tablet Take 1 tablet (10 mg total) by  mouth daily. 30 tablet 2  . Vitamin D, Ergocalciferol, (DRISDOL) 1.25 MG (50000 UT) CAPS capsule Take 1 capsule (50,000 Units total) by mouth every 7 (seven) days. 12 capsule 0   No current facility-administered medications for this visit.     Review of Systems Review of Systems  Respiratory:       Asthma  All other systems reviewed and are negative.   Blood pressure (!) 172/96, pulse 88, temperature 98.4 F (36.9 C), height 5\' 4"  (1.626 m), weight 262 lb 3.2 oz (118.9 kg), SpO2 98 %.  Physical Exam Physical Exam Constitutional:      General: He is not in acute distress.    Appearance: Normal appearance. He is obese.  Neck:     Musculoskeletal: Normal range of motion.     Comments: There is an approximately 1 cm ovoid node in the right neck just posterior to the angle of the mandible.  It is rubbery and does not feel fixed to any of the surrounding tissues.  There is no erythema or tenderness  present. Cardiovascular:     Rate and Rhythm: Normal rate and regular rhythm.     Pulses: Normal pulses.  Pulmonary:     Effort: Pulmonary effort is normal.     Breath sounds: Normal breath sounds.  Abdominal:     General: Bowel sounds are normal.     Palpations: Abdomen is soft.     Comments: Protuberant, consistent with his level of obesity.  Genitourinary:    Comments: Deferred Musculoskeletal: Normal range of motion.        General: No deformity.  Lymphadenopathy:     Cervical: Cervical adenopathy present.  Skin:    General: Skin is warm and dry.  Neurological:     General: No focal deficit present.     Mental Status: He is alert and oriented to person, place, and time.  Psychiatric:        Mood and Affect: Mood normal.        Behavior: Behavior normal.        Thought Content: Thought content normal.     Data Reviewed I personally reviewed the imaging from both the CT scan and the ultrasound.  On my assessment, I do not appreciate any worrisome abnormalities on the ultrasound findings, however the radiologist report is copied here:  EXAM: ULTRASOUND OF HEAD/NECK SOFT TISSUES  TECHNIQUE: Ultrasound examination of the head and neck soft tissues was performed in the area of clinical concern.  COMPARISON:  None.  FINDINGS: Adjacent to the angle of the mandible on the right, there is a 1.3 x 1.4 x 0.6 cm ovoid mass with blood flow and homogeneous echotexture. Although the short axis diameter is only 6 mm, this does not look like a normal lymph node. Therefore, consider neck CT with contrast complete evaluation.  IMPRESSION: 1.3 x 1.4 x 0.6 cm ovoid mass adjacent to the angle of the mandible on the right. Although short axis diameter is only 6 mm, the appearance is not that of a normal lymph node. Therefore, consider neck CT with contrast.  I also reviewed the imaging from the CT scan.  I agree with the radiologist's assessment that these are likely reactive  nodes.  The formal report is copied here:  EXAM: CT NECK WITH CONTRAST  TECHNIQUE: Multidetector CT imaging of the neck was performed using the standard protocol following the bolus administration of intravenous contrast.  CONTRAST:  70mL OMNIPAQUE IOHEXOL 300 MG/ML  SOLN  COMPARISON:  None.  FINDINGS: Pharynx and larynx: Mild thickening of tonsils. No superimposed mass or edema/swelling.  Salivary glands: No inflammation, mass, or stone.  Thyroid: Normal.  Lymph nodes: Palpable marker over the right neck reflects a mildly enlarged lymph node at the posterior margin of the sternocleidomastoid. The node is 1 of multiple symmetric mildly enlarged lymph nodes in the jugular chains. No nodal cavitation, calcification or surrounding fat inflammation.  Vascular: Negative  Limited intracranial: Negative  Visualized orbits: Negative  Mastoids and visualized paranasal sinuses: Clear  Skeleton: No acute or aggressive finding  Upper chest: Negative  IMPRESSION: Palpable complaint in the right neck reflects a 12 mm lymph node. The node is 1 of multiple mildly enlarged nodes in the bilateral neck. These are nonspecific and could be reactive; the tonsils are also somewhat hypertrophic for age. Recommend correlation for any systemic illness and recommend clinical follow-up.  Assessment This is a 53 year old male smoker who has a one-year history of a posterior neck lymph node.  It has been unchanged in that time and he does not have any systemic B symptoms.  The sonographic and CT imaging appears most consistent with a reactive node.  He does have hypertrophic tonsils and in light of his smoking history, these may need further evaluation; I did not perform an examination of the patient's posterior oropharynx, secondary to COVID-19 precautions.  I do not think he needs any surgical intervention at this time, however.  Plan If further evaluation of the tonsils is  desired, I would recommend a referral to otolaryngology as they are better equipped to perform a complete examination of the area, as well as deal with any tonsillar issues from a surgical perspective.  That said, however, I think the likelihood is that this is reactive in nature, particularly in light of the lack of change over the course of a year.  I would recommend repeat imaging in 1 years time, however should the node enlarge or change in character, sooner evaluation would be advised.  I have not made a formal follow-up in our clinic, however he is welcome to contact us with any questions or concerns.    Duanne GuessJennifer Jolynda Townley 09/22/2018, 5:24 PM

## 2018-09-22 NOTE — Patient Instructions (Signed)
Follow up with Primary care provider in one year for repeat imaging.    Follow-up with our office as needed.  Please call and ask to speak with a nurse if you develop questions or concerns.

## 2018-11-08 ENCOUNTER — Other Ambulatory Visit: Payer: Self-pay

## 2018-11-08 MED ORDER — ATORVASTATIN CALCIUM 40 MG PO TABS: 40.0000 mg | ORAL_TABLET | Freq: Every day | ORAL | 1 refills | Status: DC

## 2018-11-23 ENCOUNTER — Encounter: Payer: Self-pay | Admitting: Family Medicine

## 2018-11-23 ENCOUNTER — Ambulatory Visit: Payer: 59 | Admitting: Family Medicine

## 2018-11-23 ENCOUNTER — Other Ambulatory Visit: Payer: Self-pay

## 2018-11-23 VITALS — BP 130/90 | HR 72 | Temp 96.8°F | Resp 16 | Ht 64.0 in | Wt 263.8 lb

## 2018-11-23 DIAGNOSIS — J351 Hypertrophy of tonsils: Secondary | ICD-10-CM

## 2018-11-23 DIAGNOSIS — R591 Generalized enlarged lymph nodes: Secondary | ICD-10-CM

## 2018-11-23 DIAGNOSIS — G4733 Obstructive sleep apnea (adult) (pediatric): Secondary | ICD-10-CM

## 2018-11-23 DIAGNOSIS — L239 Allergic contact dermatitis, unspecified cause: Secondary | ICD-10-CM

## 2018-11-23 DIAGNOSIS — R809 Proteinuria, unspecified: Secondary | ICD-10-CM

## 2018-11-23 DIAGNOSIS — Z23 Encounter for immunization: Secondary | ICD-10-CM | POA: Diagnosis not present

## 2018-11-23 DIAGNOSIS — E8881 Metabolic syndrome: Secondary | ICD-10-CM | POA: Diagnosis not present

## 2018-11-23 DIAGNOSIS — Z6841 Body Mass Index (BMI) 40.0 and over, adult: Secondary | ICD-10-CM

## 2018-11-23 DIAGNOSIS — E559 Vitamin D deficiency, unspecified: Secondary | ICD-10-CM

## 2018-11-23 DIAGNOSIS — G4726 Circadian rhythm sleep disorder, shift work type: Secondary | ICD-10-CM

## 2018-11-23 DIAGNOSIS — I10 Essential (primary) hypertension: Secondary | ICD-10-CM | POA: Diagnosis not present

## 2018-11-23 DIAGNOSIS — L739 Follicular disorder, unspecified: Secondary | ICD-10-CM

## 2018-11-23 DIAGNOSIS — E785 Hyperlipidemia, unspecified: Secondary | ICD-10-CM

## 2018-11-23 LAB — COMPLETE METABOLIC PANEL WITH GFR
AG Ratio: 1.2 (calc) (ref 1.0–2.5)
ALT: 9 U/L (ref 9–46)
AST: 15 U/L (ref 10–35)
Albumin: 4.1 g/dL (ref 3.6–5.1)
Alkaline phosphatase (APISO): 67 U/L (ref 35–144)
BUN: 13 mg/dL (ref 7–25)
CO2: 28 mmol/L (ref 20–32)
Calcium: 9.6 mg/dL (ref 8.6–10.3)
Chloride: 105 mmol/L (ref 98–110)
Creat: 1.03 mg/dL (ref 0.70–1.33)
GFR, Est African American: 97 mL/min/{1.73_m2} (ref >=60)
GFR, Est Non African American: 84 mL/min/{1.73_m2} (ref >=60)
Globulin: 3.5 g/dL (calc) (ref 1.9–3.7)
Glucose, Bld: 101 mg/dL — ABNORMAL HIGH (ref 65–99)
Potassium: 4.5 mmol/L (ref 3.5–5.3)
Sodium: 139 mmol/L (ref 135–146)
Total Bilirubin: 0.5 mg/dL (ref 0.2–1.2)
Total Protein: 7.6 g/dL (ref 6.1–8.1)

## 2018-11-23 LAB — LIPID PANEL
Cholesterol: 188 mg/dL (ref <200)
HDL: 32 mg/dL — ABNORMAL LOW (ref >=40)
LDL Cholesterol (Calc): 131 mg/dL (calc) — ABNORMAL HIGH
Non-HDL Cholesterol (Calc): 156 mg/dL (calc) — ABNORMAL HIGH (ref <130)
Total CHOL/HDL Ratio: 5.9 (calc) — ABNORMAL HIGH (ref <5.0)
Triglycerides: 142 mg/dL (ref <150)

## 2018-11-23 MED ORDER — HYDROXYZINE HCL 25 MG PO TABS: 25.0000 mg | ORAL_TABLET | Freq: Two times a day (BID) | ORAL | 5 refills | Status: DC | PRN

## 2018-11-23 MED ORDER — MODAFINIL 100 MG PO TABS: 100.0000 mg | ORAL_TABLET | Freq: Every day | ORAL | 2 refills | Status: DC

## 2018-11-23 MED ORDER — METFORMIN HCL 500 MG PO TABS: 500.0000 mg | ORAL_TABLET | Freq: Two times a day (BID) | ORAL | 1 refills | Status: DC

## 2018-11-23 MED ORDER — LORATADINE 10 MG PO TABS: 10.0000 mg | ORAL_TABLET | Freq: Every day | ORAL | 2 refills | Status: DC

## 2018-11-23 MED ORDER — LISINOPRIL 10 MG PO TABS: 10.0000 mg | ORAL_TABLET | Freq: Every day | ORAL | 1 refills | Status: DC

## 2018-11-23 MED ORDER — VITAMIN D (ERGOCALCIFEROL) 1.25 MG (50000 UNIT) PO CAPS: 50000.0000 [IU] | ORAL_CAPSULE | ORAL | 1 refills | Status: DC

## 2018-11-23 NOTE — Progress Notes (Signed)
Name: Isaac Delacruz   MRN: [id number]    DOB: 10-04-1965   Date:11/23/2018       Progress Note  Subjective  Chief Complaint  Chief Complaint  Patient presents with  . Hypertension  . Dyslipidemia  . Obesity    HPI  Morbid Obesity: he is eating fast food on week days, not exercise outside work, BMI above 40 now and discussed importance of weight loss  HTN: he is not taking bp medication now, since last visit bp was at goal, today DBP is slightly elevated, just left work, we will try resuming at a very low dose lisinopril and monitor , no chest pain or palpitation   Dyslipidemia: he is back on Lipitor since July, no myalgias, we will recheck labs today   Pre-diabetes: he has a BMI above 45  his A1C also up at 5.7% in the past, he denies polyphagia, polydipsia or polyuria. He used to take Metformin and is willing to resume medication, no side effects in the past. He has been eating fast food for lunch and dinner, he also drinks sodas and sweet tea daily. Discussed packing snacks and food for work   Dermatitis: He goes to [employer] [place], not taking antibiotics, he states skin itches a lot a night and doing better with loratadine and hydroxizine   Cervical lymphadenopathy: with abnormal US and had CT neck, referred to Dr. Lady Garyannon, she did not recommend a biopsy or removal, but advised follow up with ENT to see if the reactive lymphadenopathy is secondary to hypertrophic tonsils. Discussed with patient today  Patient Active Problem List   Diagnosis Date Noted  . Enlarged lymph node 09/22/2018  . Seasonal allergic rhinitis 08/18/2016  . Folliculitis 05/23/2015  . Allergic dermatitis 05/23/2015  . History of Bell's palsy 07/18/2014  . Benign essential HTN 07/18/2014  . Dyslipidemia 07/18/2014  . LBP (low back pain) 07/18/2014  . Vitamin D deficiency 07/18/2014  . Dysmetabolic syndrome 07/18/2014  . Microalbuminuria 07/18/2014  . Extreme obesity 07/18/2014  . Obstructive  apnea 07/18/2014  . Tobacco use 07/18/2014    Past Surgical History:  Procedure Laterality Date  . COLONOSCOPY    . COLONOSCOPY WITH PROPOFOL N/A 11/11/2016   Procedure: COLONOSCOPY WITH PROPOFOL;  Surgeon: Toney ReilVanga, Rohini Reddy, MD;  [facility];  Service: Endoscopy;  Laterality: N/A;  sleep apnea    Family History  Problem Relation Age of Onset  . Cancer Mother [id number]       Lung  . Cancer Father        Prostate  . Diabetes Father   . Hypertension Brother   . Cancer Maternal Aunt        Lung  . Heart disease Brother     Social History   Socioeconomic History  . Marital status: Married    Spouse name: Dorris   . Number of children: 7  . Years of education: Not on file  . Highest education level: 11th grade  Occupational History  . Occupation: Scientist, physiologicalline worker at the [employer]   Social Needs  . Financial resource strain: Not hard at all  . Food insecurity    Worry: Never true    Inability: Never true  . Transportation needs    Medical: No    Non-medical: No  Tobacco Use  . Smoking status: Current Some Day Smoker    Years: 22.00    Types: Cigars    Start date: 10/12/1997  . Smokeless tobacco: Never Used  Substance and Sexual Activity  . Alcohol use: Yes    Alcohol/week: 0.0 standard drinks    Comment: rarely  . Drug use: No  . Sexual activity: Yes    Partners: Female  Lifestyle  . Physical activity    Days per week: 0 days    Minutes per session: 0 min  . Stress: Not at all  Relationships  . Social connections    Talks on phone: More than three times a week    Gets together: More than three times a week    Attends religious service: More than 4 times per year    Active member of club or organization: Yes    Attends meetings of clubs or organizations: Never    Relationship status: Married  . Intimate partner violence    Fear of current or ex partner: No    Emotionally abused: No    Physically abused: No    Forced sexual activity: No  Other  Topics Concern  . Not on file  Social History Narrative   Works full time at [employer] in the [employer]    Lives with wife  and they have 3 children together, just one child at home      Current Outpatient Medications:  .  aspirin 81 MG tablet, Take by mouth., Disp: , Rfl:  .  atorvastatin (LIPITOR) 40 MG tablet, Take 1 tablet (40 mg total) by mouth daily., Disp: 90 tablet, Rfl: 1 .  fluticasone (FLONASE) 50 MCG/ACT nasal spray, Place 2 sprays into both nostrils daily., Disp: 16 g, Rfl: 2 .  hydrOXYzine (ATARAX/VISTARIL) 25 MG tablet, Take 1 tablet (25 mg total) by mouth 2 (two) times daily as needed., Disp: 60 tablet, Rfl: 5 .  loratadine (CLARITIN) 10 MG tablet, Take 1 tablet (10 mg total) by mouth daily., Disp: 30 tablet, Rfl: 2 .  Vitamin D, Ergocalciferol, (DRISDOL) 1.25 MG (50000 UT) CAPS capsule, Take 1 capsule (50,000 Units total) by mouth every 7 (seven) days., Disp: 12 capsule, Rfl: 1 .  lisinopril (ZESTRIL) 10 MG tablet, Take 1 tablet (10 mg total) by mouth daily., Disp: 90 tablet, Rfl: 1 .  metFORMIN (GLUCOPHAGE) 500 MG tablet, Take 1 tablet (500 mg total) by mouth 2 (two) times daily with a meal., Disp: 180 tablet, Rfl: 1  No Known Allergies  I personally reviewed active problem list, medication list, allergies, family history, social history, health maintenance with the patient/caregiver today.   ROS  Constitutional: Negative for fever, positive for  weight change.  Respiratory: Negative for cough and shortness of breath.   Cardiovascular: Negative for chest pain or palpitations.  Gastrointestinal: Negative for abdominal pain, no bowel changes.  Musculoskeletal: Negative for gait problem or joint swelling.  Skin: Positive  for rash on neck and scalp .  Neurological: Negative for dizziness or headache.  No other specific complaints in a complete review of systems (except as listed in HPI above).  Objective  Vitals:   11/23/18 0842  BP: 130/90  Pulse: 72  Resp: 16   Temp: (!) 96.8 F (36 C)  TempSrc: Temporal  SpO2: 97%  Weight: 263 lb 12.8 oz (119.7 kg)  Height: 5\' 4"  (1.626 m)    Body mass index is 45.28 kg/m.  Physical Exam  Constitutional: Patient appears well-developed and well-nourished. Obese No distress.  HEENT: head atraumatic, normocephalic, pupils equal and reactive to light, non tender lymphadenopathy on right posterior neck Cardiovascular: Normal rate, regular rhythm and normal heart sounds.  No  murmur heard. No BLE edema. Pulmonary/Chest: Effort normal and breath sounds normal. No respiratory distress. Abdominal: Soft.  There is no tenderness. Psychiatric: Patient has a normal mood and affect. behavior is normal. Judgment and thought content normal. Skin: scarring from folliculitis   PPJ0/9: Depression screen [facility] 2/9 11/23/2018 08/23/2018 09/09/2016 11/22/2015 05/23/2015  Decreased Interest 0 0 0 0 0  Down, Depressed, Hopeless 0 0 0 0 0  PHQ - 2 Score 0 0 0 0 0  Altered sleeping 0 0 - - -  Tired, decreased energy 0 0 - - -  Change in appetite 0 0 - - -  Feeling bad or failure about yourself  0 0 - - -  Trouble concentrating 0 0 - - -  Moving slowly or fidgety/restless 0 0 - - -  Suicidal thoughts 0 0 - - -  PHQ-9 Score 0 0 - - -    phq 9 is negative   Fall Risk: Fall Risk  11/23/2018 08/23/2018 09/09/2016 11/22/2015 05/23/2015  Falls in the past year? 0 0 No No No  Number falls in past yr: 0 0 - - -  Injury with Fall? 0 0 - - -     Assessment & Plan  1. Microalbuminuria  - lisinopril (ZESTRIL) 10 MG tablet; Take 1 tablet (10 mg total) by mouth daily.  Dispense: 90 tablet; Refill: 1  2. Obstructive apnea  Wears CPAP , he works 3rd shift and drinks sodas to stay awake, we will try modafinil   3. Dysmetabolic syndrome  - metFORMIN (GLUCOPHAGE) 500 MG tablet; Take 1 tablet (500 mg total) by mouth 2 (two) times daily with a meal.  Dispense: 180 tablet; Refill: 1  4. Dyslipidemia  - Lipid panel  5. Vitamin D  deficiency  - Vitamin D, Ergocalciferol, (DRISDOL) 1.25 MG (50000 UT) CAPS capsule; Take 1 capsule (50,000 Units total) by mouth every 7 (seven) days.  Dispense: 12 capsule; Refill: 1  6. Benign essential HTN  - COMPLETE METABOLIC PANEL WITH GFR - lisinopril (ZESTRIL) 10 MG tablet; Take 1 tablet (10 mg total) by mouth daily.  Dispense: 90 tablet; Refill: 1  7. Allergic dermatitis  - hydrOXYzine (ATARAX/VISTARIL) 25 MG tablet; Take 1 tablet (25 mg total) by mouth 2 (two) times daily as needed.  Dispense: 60 tablet; Refill: 5 - loratadine (CLARITIN) 10 MG tablet; Take 1 tablet (10 mg total) by mouth daily.  Dispense: 30 tablet; Refill: 2  8. Lymphadenopathy of head and neck  - Ambulatory referral to ENT  9. Enlarged tonsils  - Ambulatory referral to ENT  10. Need for immunization against influenza  - Flu Vaccine QUAD 36+ mos IM  11. Folliculitis   12. Morbid obesity with BMI of 45.0-49.9, adult [facility])  Discussed with the patient the risk posed by an increased BMI. Discussed importance of portion control, calorie counting and at least 150 minutes of physical activity weekly. Avoid sweet beverages and drink more water. 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  Dispense: 30 tablet; Refill: 2  Flu vaccine today

## 2018-11-24 ENCOUNTER — Telehealth: Payer: Self-pay | Admitting: Family Medicine

## 2018-11-24 NOTE — Telephone Encounter (Signed)
Pt called to get lab results.  Copied from [place] [phone]. Topic: Quick Communication - Lab Results (Clinic Use ONLY) >> Nov 24, 2018  9:58 AM Vonna Kotyk, CMA wrote: Called patient to inform them of most lab results. When patient returns call, triage nurse may disclose results.

## 2018-12-02 ENCOUNTER — Other Ambulatory Visit: Payer: Self-pay | Admitting: Otolaryngology

## 2018-12-02 DIAGNOSIS — R221 Localized swelling, mass and lump, neck: Secondary | ICD-10-CM

## 2018-12-06 ENCOUNTER — Other Ambulatory Visit: Payer: Self-pay | Admitting: Radiology

## 2018-12-07 ENCOUNTER — Ambulatory Visit
Admission: RE | Admit: 2018-12-07 | Discharge: 2018-12-07 | Disposition: A | Discharge status: Home / Self Care | Payer: 59 | Source: Ambulatory Visit | Attending: Otolaryngology | Admitting: Otolaryngology

## 2018-12-07 ENCOUNTER — Other Ambulatory Visit: Payer: Self-pay

## 2018-12-07 DIAGNOSIS — R221 Localized swelling, mass and lump, neck: Secondary | ICD-10-CM | POA: Diagnosis not present

## 2018-12-07 LAB — CBC
HCT: 45.7 % (ref 39.0–52.0)
Hemoglobin: 15.1 g/dL (ref 13.0–17.0)
MCH: 28.5 pg (ref 26.0–34.0)
MCHC: 33 g/dL (ref 30.0–36.0)
MCV: 86.4 fL (ref 80.0–100.0)
Platelets: 186 10*3/uL (ref 150–400)
RBC: 5.29 MIL/uL (ref 4.22–5.81)
RDW: 14.2 % (ref 11.5–15.5)
WBC: 6.9 10*3/uL (ref 4.0–10.5)
nRBC: 0 % (ref 0.0–0.2)

## 2018-12-07 LAB — PROTIME-INR
INR: 1 (ref 0.8–1.2)
Prothrombin Time: 13.1 seconds (ref 11.4–15.2)

## 2018-12-07 LAB — GLUCOSE, CAPILLARY: Glucose-Capillary: 83 mg/dL (ref 70–99)

## 2018-12-07 MED ORDER — FENTANYL CITRATE (PF) 100 MCG/2ML IJ SOLN
INTRAMUSCULAR | Status: AC | PRN
  Administered 2018-12-07: 50 ug via INTRAVENOUS

## 2018-12-07 MED ORDER — FENTANYL CITRATE (PF) 100 MCG/2ML IJ SOLN
INTRAMUSCULAR | Status: AC
  Filled 2018-12-07: qty 2

## 2018-12-07 MED ORDER — MIDAZOLAM HCL 2 MG/2ML IJ SOLN
INTRAMUSCULAR | Status: AC
  Filled 2018-12-07: qty 2

## 2018-12-07 MED ORDER — MIDAZOLAM HCL 2 MG/2ML IJ SOLN
INTRAMUSCULAR | Status: AC | PRN
  Administered 2018-12-07 (×2): 1 mg via INTRAVENOUS

## 2018-12-07 MED ORDER — SODIUM CHLORIDE 0.9 % IV SOLN
INTRAVENOUS | Status: DC
  Administered 2018-12-07: 1000 mL via INTRAVENOUS

## 2018-12-07 NOTE — Procedures (Signed)
Interventional Radiology Procedure Note  Procedure: US guided core biopsy of right posterior cervical LN  Complications: None  Estimated Blood Loss: None  Recommendations: None  Signed,  Criselda Peaches, MD

## 2018-12-07 NOTE — Progress Notes (Signed)
Patient clinically stable post Cervical Lymph Node biopsy per Dr Laurence Ferrari, tolerated well. Vitals stable throughout procedure and after. Awake and oriented post procedure. bandade dressing dry and intact to right neck, denies complaints at this time.

## 2018-12-07 NOTE — Discharge Instructions (Signed)
Needle Biopsy, Care After °These instructions tell you how to care for yourself after your procedure. Your doctor may also give you more specific instructions. Call your doctor if you have any problems or questions. °What can I expect after the procedure? °After the procedure, it is common to have: °· Soreness. °· Bruising. °· Mild pain. °Follow these instructions at home: ° °· Return to your normal activities as told by your doctor. Ask your doctor what activities are safe for you. °· Take over-the-counter and prescription medicines only as told by your doctor. °· Wash your hands with soap and water before you change your bandage (dressing). If you cannot use soap and water, use hand sanitizer. °· Follow instructions from your doctor about: °? How to take care of your puncture site. °? When and how to change your bandage. °? When to remove your bandage. °· Check your puncture site every day for signs of infection. Watch for: °? Redness, swelling, or pain. °? Fluid or blood.  °? Pus or a bad smell. °? Warmth. °· Do not take baths, swim, or use a hot tub until your doctor approves. Ask your doctor if you may take showers. You may only be allowed to take sponge baths. °· Keep all follow-up visits as told by your doctor. This is important. °Contact a doctor if you have: °· A fever. °· Redness, swelling, or pain at the puncture site, and it lasts longer than a few days. °· Fluid, blood, or pus coming from the puncture site. °· Warmth coming from the puncture site. °Get help right away if: °· You have a lot of bleeding from the puncture site. °Summary °· After the procedure, it is common to have soreness, bruising, or mild pain at the puncture site. °· Check your puncture site every day for signs of infection, such as redness, swelling, or pain. °· Get help right away if you have severe bleeding from your puncture site. °This information is not intended to replace advice given to you by your health care provider. Make  sure you discuss any questions you have with your health care provider. °Document Released: 01/02/2008 Document Revised: 02/01/2017 Document Reviewed: 02/01/2017 °Elsevier Patient Education © 2020 [employer] Inc. ° °

## 2018-12-08 ENCOUNTER — Encounter: Payer: Self-pay | Admitting: Otolaryngology

## 2018-12-08 LAB — SURGICAL PATHOLOGY

## 2019-02-10 ENCOUNTER — Other Ambulatory Visit: Payer: 59

## 2019-03-27 ENCOUNTER — Other Ambulatory Visit: Payer: Self-pay

## 2019-03-27 ENCOUNTER — Encounter: Payer: Self-pay | Admitting: Family Medicine

## 2019-03-27 ENCOUNTER — Ambulatory Visit: Payer: 59 | Admitting: Family Medicine

## 2019-03-27 VITALS — BP 118/74 | HR 88 | Temp 97.9°F | Resp 18 | Ht 64.0 in | Wt 253.8 lb

## 2019-03-27 DIAGNOSIS — E559 Vitamin D deficiency, unspecified: Secondary | ICD-10-CM

## 2019-03-27 DIAGNOSIS — E8881 Metabolic syndrome: Secondary | ICD-10-CM

## 2019-03-27 DIAGNOSIS — R809 Proteinuria, unspecified: Secondary | ICD-10-CM

## 2019-03-27 DIAGNOSIS — E785 Hyperlipidemia, unspecified: Secondary | ICD-10-CM | POA: Diagnosis not present

## 2019-03-27 DIAGNOSIS — G4726 Circadian rhythm sleep disorder, shift work type: Secondary | ICD-10-CM

## 2019-03-27 DIAGNOSIS — G4733 Obstructive sleep apnea (adult) (pediatric): Secondary | ICD-10-CM

## 2019-03-27 DIAGNOSIS — I10 Essential (primary) hypertension: Secondary | ICD-10-CM

## 2019-03-27 DIAGNOSIS — L239 Allergic contact dermatitis, unspecified cause: Secondary | ICD-10-CM

## 2019-03-27 DIAGNOSIS — L739 Follicular disorder, unspecified: Secondary | ICD-10-CM

## 2019-03-27 DIAGNOSIS — R591 Generalized enlarged lymph nodes: Secondary | ICD-10-CM

## 2019-03-27 MED ORDER — HYDROXYZINE HCL 25 MG PO TABS: 25.0000 mg | ORAL_TABLET | Freq: Two times a day (BID) | ORAL | 1 refills | Status: DC | PRN

## 2019-03-27 MED ORDER — ATORVASTATIN CALCIUM 40 MG PO TABS: 40.0000 mg | ORAL_TABLET | Freq: Every day | ORAL | 1 refills | Status: DC

## 2019-03-27 MED ORDER — METFORMIN HCL 500 MG PO TABS: 500.0000 mg | ORAL_TABLET | Freq: Two times a day (BID) | ORAL | 1 refills | Status: DC

## 2019-03-27 MED ORDER — ROSUVASTATIN CALCIUM 40 MG PO TABS: 40.0000 mg | ORAL_TABLET | Freq: Every day | ORAL | 1 refills | Status: DC

## 2019-03-27 MED ORDER — VITAMIN D (ERGOCALCIFEROL) 1.25 MG (50000 UNIT) PO CAPS: 50000.0000 [IU] | ORAL_CAPSULE | ORAL | 1 refills | Status: DC

## 2019-03-27 MED ORDER — LISINOPRIL 10 MG PO TABS: 10.0000 mg | ORAL_TABLET | Freq: Every day | ORAL | 1 refills | Status: DC

## 2019-03-27 NOTE — Progress Notes (Signed)
Name: Isaac Delacruz   MRN: [id number]    DOB: 02-06-1965   Date:03/27/2019       Progress Note  Subjective  Chief Complaint  Chief Complaint  Patient presents with  . Medication Refill  . Hypertension    Denies any symptoms  . Morbid Obesity  . Dyslipidemia  . Pre-diabetes    HPI  Morbid Obesity: he is cutting down on fast food, walking with son for about one hour three times a week and has lost 10 lbs since last visit   HTN: he is now taking bp medication daily and bp is at goal, no chest pain or palpitation   Dyslipidemia: he is back on Lipitor since July, and last LDL was down to 131, HDL was stable at 32 , discussed going up on dose, and he is willing to try Crestor 40 mg and we will recheck labs next visit  The 10-year ASCVD risk score Denman George DC Montez Hageman., et al., 2013) is: 14.8%   Values used to calculate the score:     Age: 42 years     Sex: Male     Is Non-Hispanic African American: Yes     Diabetic: No     Tobacco smoker: Yes     Systolic Blood Pressure: 118 mmHg     Is BP treated: Yes     HDL Cholesterol: 32 mg/dL     Total Cholesterol: 188 mg/dL  Pre-diabetes: BMI is below 45 now,  A1C was 5.9% July 2021  he denies polyphagia, polydipsia or polyuria. He is back on Metformin since July and is doing well, walking , eating healthier and lost 0 lbs.   Dermatitis: He goes to Dermatologist in  [place], not taking antibiotics, he states skin itches a lot a night and doing better with loratadine and hydroxizine , no problems at this time.   Cervical lymphadenopathy: with abnormal [place] and had CT neck, referred to Dr. Lady Gary, she did not recommend a biopsy or removal, he saw Dr. Genevive Bi and biopsy was done 12/2018 and it was reactive lymphadenopathy.   OSA: not wearing CPAP every night, still snores occasionally    Patient Active Problem List   Diagnosis Date Noted  . Enlarged lymph node 09/22/2018  . Seasonal allergic rhinitis 08/18/2016  . Folliculitis 05/23/2015  .  Allergic dermatitis 05/23/2015  . History of Bell's palsy 07/18/2014  . Benign essential HTN 07/18/2014  . Dyslipidemia 07/18/2014  . LBP (low back pain) 07/18/2014  . Vitamin D deficiency 07/18/2014  . Dysmetabolic syndrome 07/18/2014  . Microalbuminuria 07/18/2014  . Extreme obesity 07/18/2014  . Obstructive apnea 07/18/2014  . Tobacco use 07/18/2014    Past Surgical History:  Procedure Laterality Date  . COLONOSCOPY    . COLONOSCOPY WITH PROPOFOL N/A 11/11/2016   Procedure: COLONOSCOPY WITH PROPOFOL;  Surgeon: Toney Reil, MD;  Location: [facility];  Service: Endoscopy;  Laterality: N/A;  sleep apnea    Family History  Problem Relation Age of Onset  . Cancer Mother 3       Lung  . Cancer Father        Prostate  . Diabetes Father   . Hypertension Brother   . Cancer Maternal Aunt        Lung  . Heart disease Brother     Social History   Tobacco Use  . Smoking status: Current Some Day Smoker    Years: 22.00    Types: Cigars    Start date: 10/12/1997  .  Smokeless tobacco: Never Used  . Tobacco comment: Smokes 2 cigars daily  Substance Use Topics  . Alcohol use: Yes    Alcohol/week: 0.0 standard drinks    Comment: rarely  . Drug use: No     Current Outpatient Medications:  .  aspirin 81 MG tablet, Take by mouth., Disp: , Rfl:  .  atorvastatin (LIPITOR) 40 MG tablet, Take 1 tablet (40 mg total) by mouth daily., Disp: 90 tablet, Rfl: 1 .  fluticasone (FLONASE) 50 MCG/ACT nasal spray, Place 2 sprays into both nostrils daily., Disp: 16 g, Rfl: 2 .  hydrOXYzine (ATARAX/VISTARIL) 25 MG tablet, Take 1 tablet (25 mg total) by mouth 2 (two) times daily as needed., Disp: 60 tablet, Rfl: 5 .  lisinopril (ZESTRIL) 10 MG tablet, Take 1 tablet (10 mg total) by mouth daily., Disp: 90 tablet, Rfl: 1 .  loratadine (CLARITIN) 10 MG tablet, Take 1 tablet (10 mg total) by mouth daily., Disp: 30 tablet, Rfl: 2 .  metFORMIN (GLUCOPHAGE) 500 MG tablet, Take 1 tablet  (500 mg total) by mouth 2 (two) times daily with a meal., Disp: 180 tablet, Rfl: 1 .  modafinil (PROVIGIL) 100 MG tablet, Take 1 tablet (100 mg total) by mouth daily., Disp: 30 tablet, Rfl: 2 .  omeprazole (PRILOSEC) 40 MG capsule, Take 40 mg by mouth daily., Disp: , Rfl:  .  Vitamin D, Ergocalciferol, (DRISDOL) 1.25 MG (50000 UT) CAPS capsule, Take 1 capsule (50,000 Units total) by mouth every 7 (seven) days., Disp: 12 capsule, Rfl: 1  No Known Allergies  I personally reviewed active problem list, medication list, allergies, family history, social history, health maintenance with the patient/caregiver today.   ROS  Constitutional: Negative for fever, positive for weight loss Respiratory: Negative for cough and shortness of breath.   Cardiovascular: Negative for chest pain or palpitations.  Gastrointestinal: Negative for abdominal pain, no bowel changes.  Musculoskeletal: Negative for gait problem or joint swelling.  Skin: Positive for rash.  Neurological: Negative for dizziness or headache.  No other specific complaints in a complete review of systems (except as listed in HPI above).   Objective  Vitals:   03/27/19 0851  BP: 118/74  Pulse: 88  Resp: 18  Temp: 97.9 F (36.6 C)  TempSrc: Temporal  SpO2: 99%  Weight: 253 lb 12.8 oz (115.1 kg)  Height: 5\' 4"  (1.626 m)    Body mass index is 43.56 kg/m.  Physical Exam  Constitutional: Patient appears well-developed and well-nourished. Obese No distress.  HEENT: head atraumatic, normocephalic, pupils equal and reactive to light Cardiovascular: Normal rate, regular rhythm and normal heart sounds.  No murmur heard. No BLE edema. Pulmonary/Chest: Effort normal and breath sounds normal. No respiratory distress. Abdominal: Soft.  There is no tenderness. Skin: mostly scars now, folliculitis under control Psychiatric: Patient has a normal mood and affect. behavior is normal. Judgment and thought content  normal.  PHQ2/9: Depression screen [facility] 2/9 03/27/2019 11/23/2018 08/23/2018 09/09/2016 11/22/2015  Decreased Interest 0 0 0 0 0  Down, Depressed, Hopeless 0 0 0 0 0  PHQ - 2 Score 0 0 0 0 0  Altered sleeping 0 0 0 - -  Tired, decreased energy 0 0 0 - -  Change in appetite 0 0 0 - -  Feeling bad or failure about yourself  0 0 0 - -  Trouble concentrating 0 0 0 - -  Moving slowly or fidgety/restless 0 0 0 - -  Suicidal thoughts 0 0 0 - -  PHQ-9 Score 0 0 0 - -  Difficult doing work/chores Not difficult at all - - - -    phq 9 is negative   Fall Risk: Fall Risk  03/27/2019 11/23/2018 08/23/2018 09/09/2016 11/22/2015  Falls in the past year? 0 0 0 No No  Number falls in past yr: 0 0 0 - -  Injury with Fall? 0 0 0 - -     Functional Status Survey: Is the patient deaf or have difficulty hearing?: No Does the patient have difficulty seeing, even when wearing glasses/contacts?: Yes Does the patient have difficulty concentrating, remembering, or making decisions?: No Does the patient have difficulty walking or climbing stairs?: No Does the patient have difficulty dressing or bathing?: No Does the patient have difficulty doing errands alone such as visiting a doctor's office or shopping?: No    Assessment & Plan  1. Dyslipidemia  Stop Atorvastatin and switch to CRestor 40 mg today   2. Microalbuminuria  - lisinopril (ZESTRIL) 10 MG tablet; Take 1 tablet (10 mg total) by mouth daily.  Dispense: 90 tablet; Refill: 1  3. Obstructive apnea  Wearing it occasionally, advised to use it every night   4. Vitamin D deficiency  - Vitamin D, Ergocalciferol, (DRISDOL) 1.25 MG (50000 UNIT) CAPS capsule; Take 1 capsule (50,000 Units total) by mouth every 7 (seven) days.  Dispense: 12 capsule; Refill: 1  5. Dysmetabolic syndrome  - metFORMIN (GLUCOPHAGE) 500 MG tablet; Take 1 tablet (500 mg total) by mouth 2 (two) times daily with a meal.  Dispense: 180 tablet; Refill: 1  6. Benign  essential HTN  - lisinopril (ZESTRIL) 10 MG tablet; Take 1 tablet (10 mg total) by mouth daily.  Dispense: 90 tablet; Refill: 1  7. Allergic dermatitis  - hydrOXYzine (ATARAX/VISTARIL) 25 MG tablet; Take 1 tablet (25 mg total) by mouth 2 (two) times daily as needed.  Dispense: 180 tablet; Refill: 1  8. Lymphadenopathy of head and neck  Had biopsy and negative for cancer, it was reactive   9. 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

## 2019-04-23 ENCOUNTER — Other Ambulatory Visit: Payer: Self-pay | Admitting: Family Medicine

## 2019-04-23 ENCOUNTER — Other Ambulatory Visit: Payer: Self-pay

## 2019-04-23 DIAGNOSIS — G4733 Obstructive sleep apnea (adult) (pediatric): Secondary | ICD-10-CM

## 2019-04-23 DIAGNOSIS — G4726 Circadian rhythm sleep disorder, shift work type: Secondary | ICD-10-CM

## 2019-04-23 MED ORDER — MODAFINIL 100 MG PO TABS: 100.0000 mg | ORAL_TABLET | Freq: Every day | ORAL | 2 refills | Status: DC

## 2019-04-23 NOTE — Telephone Encounter (Signed)
Requested Prescriptions  Pending Prescriptions Disp Refills  . modafinil (PROVIGIL) 100 MG tablet [Pharmacy Med Name: modafinil 100 mg tablet] 30 tablet 2    Sig: TAKE ONE TABLET BY MOUTH ONCE DAILY     Off-Protocol Failed - 04/23/2019  8:01 AM      Failed - Medication not assigned to a protocol, review manually.      Passed - Valid encounter within last 12 months    Recent Outpatient Visits          3 weeks ago Dyslipidemia   [facility] Alba Cory, MD   5 months ago Benign essential HTN   [facility], Danna Hefty, MD   8 months ago Morbid obesity, unspecified obesity type [facility])   [facility] Alba Cory, MD   2 years ago Dysmetabolic syndrome   [facility] Alba Cory, MD   2 years ago Weight loss   [facility] Alba Cory, MD      Future Appointments            In 4 months Carlynn Purl, Danna Hefty, MD [facility], [facility]

## 2019-04-23 NOTE — Progress Notes (Signed)
Refilled again. 1st attempt was printed.

## 2019-05-25 ENCOUNTER — Other Ambulatory Visit: Payer: Self-pay | Admitting: Family Medicine

## 2019-05-25 DIAGNOSIS — L239 Allergic contact dermatitis, unspecified cause: Secondary | ICD-10-CM

## 2019-09-06 ENCOUNTER — Encounter: Payer: Self-pay | Admitting: Family Medicine

## 2019-09-06 ENCOUNTER — Other Ambulatory Visit: Payer: Self-pay

## 2019-09-06 ENCOUNTER — Ambulatory Visit (INDEPENDENT_AMBULATORY_CARE_PROVIDER_SITE_OTHER): Payer: No Typology Code available for payment source | Admitting: Family Medicine

## 2019-09-06 VITALS — BP 124/76 | HR 94 | Temp 97.1°F | Resp 18 | Ht 64.0 in | Wt 257.5 lb

## 2019-09-06 DIAGNOSIS — I10 Essential (primary) hypertension: Secondary | ICD-10-CM | POA: Diagnosis not present

## 2019-09-06 DIAGNOSIS — E785 Hyperlipidemia, unspecified: Secondary | ICD-10-CM | POA: Diagnosis not present

## 2019-09-06 DIAGNOSIS — Z8042 Family history of malignant neoplasm of prostate: Secondary | ICD-10-CM

## 2019-09-06 DIAGNOSIS — R591 Generalized enlarged lymph nodes: Secondary | ICD-10-CM

## 2019-09-06 DIAGNOSIS — G4733 Obstructive sleep apnea (adult) (pediatric): Secondary | ICD-10-CM | POA: Diagnosis not present

## 2019-09-06 DIAGNOSIS — Z125 Encounter for screening for malignant neoplasm of prostate: Secondary | ICD-10-CM

## 2019-09-06 DIAGNOSIS — R809 Proteinuria, unspecified: Secondary | ICD-10-CM

## 2019-09-06 DIAGNOSIS — E8881 Metabolic syndrome: Secondary | ICD-10-CM | POA: Diagnosis not present

## 2019-09-06 DIAGNOSIS — G4726 Circadian rhythm sleep disorder, shift work type: Secondary | ICD-10-CM

## 2019-09-06 DIAGNOSIS — E559 Vitamin D deficiency, unspecified: Secondary | ICD-10-CM

## 2019-09-06 DIAGNOSIS — Z0000 Encounter for general adult medical examination without abnormal findings: Secondary | ICD-10-CM | POA: Diagnosis not present

## 2019-09-06 DIAGNOSIS — J302 Other seasonal allergic rhinitis: Secondary | ICD-10-CM

## 2019-09-06 DIAGNOSIS — L739 Follicular disorder, unspecified: Secondary | ICD-10-CM

## 2019-09-06 DIAGNOSIS — Z6841 Body Mass Index (BMI) 40.0 and over, adult: Secondary | ICD-10-CM

## 2019-09-06 DIAGNOSIS — L239 Allergic contact dermatitis, unspecified cause: Secondary | ICD-10-CM

## 2019-09-06 MED ORDER — FLUTICASONE PROPIONATE 50 MCG/ACT NA SUSP: 2.0000 | Freq: Every day | NASAL | 2 refills | Status: DC

## 2019-09-06 MED ORDER — METFORMIN HCL 500 MG PO TABS: 500.0000 mg | ORAL_TABLET | Freq: Two times a day (BID) | ORAL | 1 refills | Status: DC

## 2019-09-06 MED ORDER — MODAFINIL 100 MG PO TABS: 100.0000 mg | ORAL_TABLET | Freq: Every day | ORAL | 2 refills | Status: DC

## 2019-09-06 MED ORDER — HYDROXYZINE HCL 25 MG PO TABS: 25.0000 mg | ORAL_TABLET | Freq: Two times a day (BID) | ORAL | 1 refills | Status: DC | PRN

## 2019-09-06 MED ORDER — LORATADINE 10 MG PO TABS: 10.0000 mg | ORAL_TABLET | Freq: Every day | ORAL | 1 refills | Status: DC

## 2019-09-06 MED ORDER — VITAMIN D (ERGOCALCIFEROL) 1.25 MG (50000 UNIT) PO CAPS: 50000.0000 [IU] | ORAL_CAPSULE | ORAL | 1 refills | Status: DC

## 2019-09-06 MED ORDER — ROSUVASTATIN CALCIUM 40 MG PO TABS: 40.0000 mg | ORAL_TABLET | Freq: Every day | ORAL | 1 refills | Status: DC

## 2019-09-06 MED ORDER — LISINOPRIL 10 MG PO TABS: 10.0000 mg | ORAL_TABLET | Freq: Every day | ORAL | 1 refills | Status: DC

## 2019-09-06 NOTE — Patient Instructions (Signed)
Preventive Care 41-52 Years Old, Male Preventive care refers to lifestyle choices and visits with your health care provider that can promote health and wellness. This includes:  A yearly physical exam. This is also called an annual well check.  Regular dental and eye exams.  Immunizations.  Screening for certain conditions.  Healthy lifestyle choices, such as eating a healthy diet, getting regular exercise, not using drugs or products that contain nicotine and tobacco, and limiting alcohol use. What can I expect for my preventive care visit? Physical exam Your health care provider will check:  Height and weight. These may be used to calculate body mass index (BMI), which is a measurement that tells if you are at a healthy weight.  Heart rate and blood pressure.  Your skin for abnormal spots. Counseling Your health care provider may ask you questions about:  Alcohol, tobacco, and drug use.  Emotional well-being.  Home and relationship well-being.  Sexual activity.  Eating habits.  Work and work Statistician. What immunizations do I need?  Influenza (flu) vaccine  This is recommended every year. Tetanus, diphtheria, and pertussis (Tdap) vaccine  You may need a Td booster every 10 years. Varicella (chickenpox) vaccine  You may need this vaccine if you have not already been vaccinated. Zoster (shingles) vaccine  You may need this after age 64. Measles, mumps, and rubella (MMR) vaccine  You may need at least one dose of MMR if you were born in 1957 or later. You may also need a second dose. Pneumococcal conjugate (PCV13) vaccine  You may need this if you have certain conditions and were not previously vaccinated. Pneumococcal polysaccharide (PPSV23) vaccine  You may need one or two doses if you smoke cigarettes or if you have certain conditions. Meningococcal conjugate (MenACWY) vaccine  You may need this if you have certain conditions. Hepatitis A  vaccine  You may need this if you have certain conditions or if you travel or work in places where you may be exposed to hepatitis A. Hepatitis B vaccine  You may need this if you have certain conditions or if you travel or work in places where you may be exposed to hepatitis B. Haemophilus influenzae type b (Hib) vaccine  You may need this if you have certain risk factors. Human papillomavirus (HPV) vaccine  If recommended by your health care provider, you may need three doses over 6 months. You may receive vaccines as individual doses or as more than one vaccine together in one shot (combination vaccines). Talk with your health care provider about the risks and benefits of combination vaccines. What tests do I need? Blood tests  Lipid and cholesterol levels. These may be checked every 5 years, or more frequently if you are over 60 years old.  Hepatitis C test.  Hepatitis B test. Screening  Lung cancer screening. You may have this screening every year starting at age 43 if you have a 30-pack-year history of smoking and currently smoke or have quit within the past 15 years.  Prostate cancer screening. Recommendations will vary depending on your family history and other risks.  Colorectal cancer screening. All adults should have this screening starting at age 72 and continuing until age 2. Your health care provider may recommend screening at age 14 if you are at increased risk. You will have tests every 1-10 years, depending on your results and the type of screening test.  Diabetes screening. This is done by checking your blood sugar (glucose) after you have not eaten  for a while (fasting). You may have this done every 1-3 years.  Sexually transmitted disease (STD) testing. Follow these instructions at home: Eating and drinking  Eat a diet that includes fresh fruits and vegetables, whole grains, lean protein, and low-fat dairy products.  Take vitamin and mineral supplements as  recommended by your health care provider.  Do not drink alcohol if your health care provider tells you not to drink.  If you drink alcohol: ? Limit how much you have to 0-2 drinks a day. ? Be aware of how much alcohol is in your drink. In the [place] one drink equals one 12 oz bottle of beer (355 mL), one 5 oz glass of wine (148 mL), or one 1 oz glass of hard liquor (44 mL). Lifestyle  Take daily care of your teeth and gums.  Stay active. Exercise for at least 30 minutes on 5 or more days each week.  Do not use any products that contain nicotine or tobacco, such as cigarettes, e-cigarettes, and chewing tobacco. If you need help quitting, ask your health care provider.  If you are sexually active, practice safe sex. Use a condom or other form of protection to prevent STIs (sexually transmitted infections).  Talk with your health care provider about taking a low-dose aspirin every day starting at age 53. What's next?  Go to your health care provider once a year for a well check visit.  Ask your health care provider how often you should have your eyes and teeth checked.  Stay up to date on all vaccines. This information is not intended to replace advice given to you by your health care provider. Make sure you discuss any questions you have with your health care provider. Document Revised: 01/13/2018 Document Reviewed: 01/13/2018 Elsevier Patient Education  2020 [employer].

## 2019-09-06 NOTE — Progress Notes (Signed)
Name: Isaac Delacruz   MRN: [id number]    DOB: 10-18-1965   Date:09/06/2019       Progress Note  Subjective  Chief Complaint  Chief Complaint  Patient presents with  . Annual Exam    HPI  Patient presents for annual CPE and follow up  MorbidObesity:his diet is not good again, drinking sodas, snacking on crackers and chips, he tries to avoid sweets   HTN:he is now taking bp medication daily and bp is at goal, no chest pain, dizziness  or palpitation   Dyslipidemia: Last LDL was down to 131, HDL was stable at 32 , we switched from Atorvastatin to Rosuvastatin Feb 2021 and we will recheck labs today. No myalgias   Pre-diabetes: BMI is below 44 now, A1C was 5.9% July 2021  he denies polyphagia, polydipsia or polyuria. He is back on Metformin since July 2020. We will recheck levels today He is not following a diabetic diet, he states his father had a history of diabetes   [place]: [place], he states skin itches a lot a night and doing better with loratadine and hydroxizine, no problems at this time. He used topical medication, used a shot twice in the past, he states hydroxizine controls pruritis and helps his skin   Cervical lymphadenopathy: with abnormal [place] and had CT neck, referred to Dr. Lady Gary, she did not recommend a biopsy or removal, he saw Dr. Genevive Bi and biopsy was done 12/2018 and it was reactive lymphadenopathy, he states size is unchanged .   OSA: wearing CPAP most days, skipping at most twice a week   USPSTF grade A and B recommendations:  Diet: likes to snack and drinks sodas at least 3 20 oz bottles at work, discussed healthier diet  Exercise: he has a physical job but only walking about 30 minutes twice a week as exercise, discussed importance of increasing physical activity   Depression: phq 9 is negative Depression screen [facility] 2/9 09/06/2019 03/27/2019 11/23/2018 08/23/2018 09/09/2016  Decreased Interest 0 0 0 0 0   Down, Depressed, Hopeless 0 0 0 0 0  PHQ - 2 Score 0 0 0 0 0  Altered sleeping 0 0 0 0 -  Tired, decreased energy 0 0 0 0 -  Change in appetite 0 0 0 0 -  Feeling bad or failure about yourself  0 0 0 0 -  Trouble concentrating 0 0 0 0 -  Moving slowly or fidgety/restless 0 0 0 0 -  Suicidal thoughts 0 0 0 0 -  PHQ-9 Score 0 0 0 0 -  Difficult doing work/chores Not difficult at all Not difficult at all - - -    Hypertension:  BP Readings from Last 3 Encounters:  09/06/19 124/76  03/27/19 118/74  12/07/18 140/88    Obesity: Wt Readings from Last 3 Encounters:  09/06/19 257 lb 8 oz (116.8 kg)  03/27/19 253 lb 12.8 oz (115.1 kg)  12/07/18 261 lb (118.4 kg)   BMI Readings from Last 3 Encounters:  09/06/19 44.20 kg/m  03/27/19 43.56 kg/m  12/07/18 37.45 kg/m     Lipids:  Lab Results  Component Value Date   CHOL 188 11/23/2018   CHOL 221 (H) 08/23/2018   CHOL 170 09/09/2016   Lab Results  Component Value Date   HDL 32 (L) 11/23/2018   HDL 31 (L) 08/23/2018   HDL 36 (L) 09/09/2016   Lab Results  Component Value Date   LDLCALC 131 (  H) 11/23/2018   LDLCALC 156 (H) 08/23/2018   LDLCALC 115 (H) 09/09/2016   Lab Results  Component Value Date   TRIG 142 11/23/2018   TRIG 185 (H) 08/23/2018   TRIG 96 09/09/2016   Lab Results  Component Value Date   CHOLHDL 5.9 (H) 11/23/2018   CHOLHDL 7.1 (H) 08/23/2018   CHOLHDL 4.7 09/09/2016   No results found for: LDLDIRECT Glucose:  Glucose, Bld  Date Value Ref Range Status  11/23/2018 101 (H) 65 - 99 mg/dL Final    Comment:    .            Fasting reference interval . For someone without known diabetes, a glucose value between 100 and 125 mg/dL is consistent with prediabetes and should be confirmed with a follow-up test. .   08/23/2018 97 65 - 99 mg/dL Final    Comment:    .            Fasting reference interval .   09/09/2016 92 65 - 99 mg/dL Final   Glucose-Capillary  Date Value Ref Range Status   12/07/2018 83 70 - 99 mg/dL Final  16/11/9602 94 65 - 99 mg/dL Final      Office Visit from 09/06/2019 in [facility]  AUDIT-C Score 3      Married STD testing and prevention (HIV/chl/gon/syphilis): not interested  Hep C: up to date   Skin cancer: Discussed monitoring for atypical lesions Colorectal cancer: repeat in 2028 Prostate cancer:  Lab Results  Component Value Date   PSA 0.6 08/23/2018   PSA 0.8 09/09/2016    IPSS Questionnaire (AUA-7): Over the past month.   1)  How often have you had a sensation of not emptying your bladder completely after you finish urinating?  0 - Not at all  2)  How often have you had to urinate again less than two hours after you finished urinating? 0 - Not at all  3)  How often have you found you stopped and started again several times when you urinated?  0 - Not at all  4) How difficult have you found it to postpone urination?  0 - Not at all  5) How often have you had a weak urinary stream?  0 - Not at all  6) How often have you had to push or strain to begin urination?  0 - Not at all  7) How many times did you most typically get up to urinate from the time you went to bed until the time you got up in the morning?  0 - None  Total score:  0-7 mildly symptomatic   8-19 moderately symptomatic   20-35 severely symptomatic    Lung cancer:   Low Dose CT Chest recommended if Age 52-80 years, 30 pack-year currently smoking OR have quit w/in 15years. Patient does not qualify.   AAA:  The USPSTF recommends one-time screening with ultrasonography in men ages 52 to 22 years who have ever smoked ECG:  2017  Advanced Care Planning: A voluntary discussion about advance care planning including the explanation and discussion of advance directives.  Discussed health care proxy and Living will, and the patient was able to identify a health care proxy as wife .  Patient does not have a living will at present time. If patient does have  living will, I have requested they bring this to the clinic to be scanned in to their chart.  Patient Active Problem List  Diagnosis Date Noted  . Enlarged lymph node 09/22/2018  . Seasonal allergic rhinitis 08/18/2016  . Folliculitis 05/23/2015  . Allergic dermatitis 05/23/2015  . History of Bell's palsy 07/18/2014  . Benign essential HTN 07/18/2014  . Dyslipidemia 07/18/2014  . LBP (low back pain) 07/18/2014  . Vitamin D deficiency 07/18/2014  . Dysmetabolic syndrome 07/18/2014  . Microalbuminuria 07/18/2014  . Extreme obesity 07/18/2014  . Obstructive apnea 07/18/2014  . Tobacco use 07/18/2014    Past Surgical History:  Procedure Laterality Date  . COLONOSCOPY    . COLONOSCOPY WITH PROPOFOL N/A 11/11/2016   Procedure: COLONOSCOPY WITH PROPOFOL;  Surgeon: Toney Reil, MD;  [facility];  Service: Endoscopy;  Laterality: N/A;  sleep apnea    Family History  Problem Relation Age of Onset  . Cancer Mother 82       Lung  . Cancer Father        Prostate  . Diabetes Father   . Hypertension Brother   . Cancer Maternal Aunt        Lung  . Heart disease Brother     Social History   Socioeconomic History  . Marital status: Married    Spouse name: Dorris   . Number of children: 7  . Years of education: Not on file  . Highest education level: 11th grade  Occupational History  . Occupation: Scientist, physiological at the [employer]   Tobacco Use  . Smoking status: Current Some Day Smoker    Years: 22.00    Types: Cigars    Start date: 10/12/1997  . Smokeless tobacco: Never Used  . Tobacco comment: Smokes 2 cigars daily  Vaping Use  . Vaping Use: Never used  Substance and Sexual Activity  . Alcohol use: Yes    Alcohol/week: 0.0 standard drinks    Comment: rarely  . Drug use: No  . Sexual activity: Yes    Partners: Female  Other Topics Concern  . Not on file  Social History Narrative   Works full time at [employer] in the [employer]    Lives with  wife  and they have 3 children together, just one child at home    Social Determinants of Health   Financial Resource Strain: Low Risk   . Difficulty of Paying Living Expenses: Not hard at all  Food Insecurity: No Food Insecurity  . Worried About Programme researcher, broadcasting/film/video in the Last Year: Never true  . Ran Out of Food in the Last Year: Never true  Transportation Needs: No Transportation Needs  . Lack of Transportation (Medical): No  . Lack of Transportation (Non-Medical): No  Physical Activity: Sufficiently Active  . Days of Exercise per Week: 7 days  . Minutes of Exercise per Session: 30 min  Stress: No Stress Concern Present  . Feeling of Stress : Not at all  Social Connections: Socially Integrated  . Frequency of Communication with Friends and Family: Never  . Frequency of Social Gatherings with Friends and Family: More than three times a week  . Attends Religious Services: More than 4 times per year  . Active Member of Clubs or Organizations: Yes  . Attends Banker Meetings: More than 4 times per year  . Marital Status: Married  Catering manager Violence: Not At Risk  . Fear of Current or Ex-Partner: No  . Emotionally Abused: No  . Physically Abused: No  . Sexually Abused: No     Current Outpatient Medications:  .  aspirin 81 MG tablet, Take by mouth., Disp: , Rfl:  .  fluticasone (FLONASE) 50 MCG/ACT nasal spray, Place 2 sprays into both nostrils daily., Disp: 16 g, Rfl: 2 .  hydrOXYzine (ATARAX/VISTARIL) 25 MG tablet, Take 1 tablet (25 mg total) by mouth 2 (two) times daily as needed., Disp: 180 tablet, Rfl: 1 .  lisinopril (ZESTRIL) 10 MG tablet, Take 1 tablet (10 mg total) by mouth daily., Disp: 90 tablet, Rfl: 1 .  loratadine (CLARITIN) 10 MG tablet, TAKE ONE TABLET BY MOUTH ONCE DAILY, Disp: 90 tablet, Rfl: 0 .  metFORMIN (GLUCOPHAGE) 500 MG tablet, Take 1 tablet (500 mg total) by mouth 2 (two) times daily with a meal., Disp: 180 tablet, Rfl: 1 .  modafinil  (PROVIGIL) 100 MG tablet, Take 1 tablet (100 mg total) by mouth daily., Disp: 30 tablet, Rfl: 2 .  omeprazole (PRILOSEC) 40 MG capsule, Take 40 mg by mouth daily., Disp: , Rfl:  .  rosuvastatin (CRESTOR) 40 MG tablet, Take 1 tablet (40 mg total) by mouth daily. In place of Atorvastatin, Disp: 90 tablet, Rfl: 1 .  Vitamin D, Ergocalciferol, (DRISDOL) 1.25 MG (50000 UNIT) CAPS capsule, Take 1 capsule (50,000 Units total) by mouth every 7 (seven) days., Disp: 12 capsule, Rfl: 1  No Known Allergies   ROS  Constitutional: Negative for fever or weight change.  Respiratory: Negative for cough and shortness of breath.   Cardiovascular: Negative for chest pain or palpitations.  Gastrointestinal: Negative for abdominal pain, no bowel changes.  Musculoskeletal: Negative for gait problem or joint swelling.  Skin: Negative for rash.  Neurological: Negative for dizziness or headache.  No other specific complaints in a complete review of systems (except as listed in HPI above).   Objective  Vitals:   09/06/19 0757  BP: 124/76  Pulse: 94  Resp: 18  Temp: (!) 97.1 F (36.2 C)  SpO2: 100%  Weight: 257 lb 8 oz (116.8 kg)  Height:  (1.626 m)    Body mass index is 44.2 kg/m.  Physical Exam  Constitutional: Patient appears well-developed and well-nourished. Obesity  No distress.  HENT: Head: Normocephalic and atraumatic. Ears: B TMs ok, no erythema or effusion; Nose: Nose normal. Mouth/Throat: not done Eyes: Conjunctivae and EOM are normal. Pupils are equal, round, and reactive to light. No scleral icterus.  Neck: Normal range of motion. Neck supple. No JVD present. No thyromegaly present.  Cardiovascular: Normal rate, regular rhythm and normal heart sounds.  No murmur heard. No BLE edema. Pulmonary/Chest: Effort normal and breath sounds normal. No respiratory distress. Abdominal: Soft. Bowel sounds are normal, no distension. There is no tenderness. no masses MALE GENITALIA: Normal  descended testes bilaterally, no masses palpated, no hernias, no lesions, no discharge RECTAL: not done Musculoskeletal: Normal range of motion, no joint effusions. No gross deformities Neurological: he is alert and oriented to person, place, and time. No cranial nerve deficit. Coordination, balance, strength, speech and gait are normal.  Skin: Skin is warm and dry. No rash noted. No erythema.  Psychiatric: Patient has a normal mood and affect. behavior is normal. Judgment and thought content normal.  PHQ2/9: Depression screen [facility] 2/9 09/06/2019 03/27/2019 11/23/2018 08/23/2018 09/09/2016  Decreased Interest 0 0 0 0 0  Down, Depressed, Hopeless 0 0 0 0 0  PHQ - 2 Score 0 0 0 0 0  Altered sleeping 0 0 0 0 -  Tired, decreased energy 0 0 0 0 -  Change in appetite 0 0 0 0 -  Feeling bad or failure about yourself  0 0 0 0 -  Trouble concentrating 0 0 0 0 -  Moving slowly or fidgety/restless 0 0 0 0 -  Suicidal thoughts 0 0 0 0 -  PHQ-9 Score 0 0 0 0 -  Difficult doing work/chores Not difficult at all Not difficult at all - - -    Fall Risk: Fall Risk  09/06/2019 03/27/2019 11/23/2018 08/23/2018 09/09/2016  Falls in the past year? 0 0 0 0 No  Number falls in past yr: 0 0 0 0 -  Injury with Fall? 0 0 0 0 -  Follow up Falls evaluation completed - - - -     Assessment & Plan  1. Dyslipidemia  - Lipid panel - rosuvastatin (CRESTOR) 40 MG tablet; Take 1 tablet (40 mg total) by mouth daily. In place of Atorvastatin  Dispense: 90 tablet; Refill: 1  2. Well adult exam  He will try to decrease soda intake/sugary beverages   3. Obstructive apnea  - modafinil (PROVIGIL) 100 MG tablet; Take 1 tablet (100 mg total) by mouth daily.  Dispense: 30 tablet; Refill: 2  4. Benign essential HTN  - COMPLETE METABOLIC PANEL WITH GFR - CBC with Differential/Platelet - Microalbumin / creatinine urine ratio - lisinopril (ZESTRIL) 10 MG tablet; Take 1 tablet (10 mg total) by mouth daily.  Dispense: 90 tablet;  Refill: 1  5. Dysmetabolic syndrome  - Hemoglobin A1c - metFORMIN (GLUCOPHAGE) 500 MG tablet; Take 1 tablet (500 mg total) by mouth 2 (two) times daily with a meal.  Dispense: 180 tablet; Refill: 1  6. Microalbuminuria  - Microalbumin / creatinine urine ratio - lisinopril (ZESTRIL) 10 MG tablet; Take 1 tablet (10 mg total) by mouth daily.  Dispense: 90 tablet; Refill: 1  7. Vitamin D deficiency  - VITAMIN D 25 Hydroxy (Vit-D Deficiency, Fractures) - Vitamin D, Ergocalciferol, (DRISDOL) 1.25 MG (50000 UNIT) CAPS capsule; Take 1 capsule (50,000 Units total) by mouth every 7 (seven) days.  Dispense: 12 capsule; Refill: 1  8. Allergic dermatitis  - hydrOXYzine (ATARAX/VISTARIL) 25 MG tablet; Take 1 tablet (25 mg total) by mouth 2 (two) times daily as needed.  Dispense: 180 tablet; Refill: 1 - loratadine (CLARITIN) 10 MG tablet; Take 1 tablet (10 mg total) by mouth daily.  Dispense: 90 tablet; Refill: 1  9. Shift work sleep disorder  - modafinil (PROVIGIL) 100 MG tablet; Take 1 tablet (100 mg total) by mouth daily.  Dispense: 30 tablet; Refill: 2  10. Lymphadenopathy of head and neck  Negative biopsy   11. Morbid obesity with BMI of 45.0-49.9, adult [facility])  Discussed with the patient the risk posed by an increased BMI. Discussed importance of portion control, calorie counting and at least 150 minutes of physical activity weekly. Avoid sweet beverages and drink more water. Eat at least 6 servings of fruit and vegetables daily   He is still drinking sodas at night, advised to unseweet type   12. Prostate cancer screening  - PSA  13. Folliculitis   14. Family history of prostate cancer  - PSA  15. Seasonal allergic rhinitis, unspecified trigger  - fluticasone (FLONASE) 50 MCG/ACT nasal spray; Place 2 sprays into both nostrils daily.  Dispense: 16 g; Refill: 2   -Prostate cancer screening and PSA options (with potential risks and benefits of testing vs not testing) were  discussed along with recent recs/guidelines. -USPSTF grade A and B recommendations reviewed with patient; age-appropriate recommendations, preventive care, screening tests, etc discussed  and encouraged; healthy living encouraged; see AVS for patient education given to patient -Discussed importance of 150 minutes of physical activity weekly, eat two servings of fish weekly, eat one serving of tree nuts ( cashews, pistachios, pecans, almonds.Marland Kitchen.) every other day, eat 6 servings of fruit/vegetables daily and drink plenty of water and avoid sweet beverages.

## 2019-09-07 LAB — COMPLETE METABOLIC PANEL WITH GFR
AG Ratio: 1.1 (calc) (ref 1.0–2.5)
ALT: 12 U/L (ref 9–46)
AST: 19 U/L (ref 10–35)
Albumin: 4.3 g/dL (ref 3.6–5.1)
Alkaline phosphatase (APISO): 65 U/L (ref 35–144)
BUN: 16 mg/dL (ref 7–25)
CO2: 26 mmol/L (ref 20–32)
Calcium: 9.7 mg/dL (ref 8.6–10.3)
Chloride: 103 mmol/L (ref 98–110)
Creat: 1.14 mg/dL (ref 0.70–1.33)
GFR, Est African American: 85 mL/min/{1.73_m2} (ref >=60)
GFR, Est Non African American: 74 mL/min/{1.73_m2} (ref >=60)
Globulin: 3.8 g/dL (calc) — ABNORMAL HIGH (ref 1.9–3.7)
Glucose, Bld: 91 mg/dL (ref 65–99)
Potassium: 4.2 mmol/L (ref 3.5–5.3)
Sodium: 139 mmol/L (ref 135–146)
Total Bilirubin: 0.5 mg/dL (ref 0.2–1.2)
Total Protein: 8.1 g/dL (ref 6.1–8.1)

## 2019-09-07 LAB — HEMOGLOBIN A1C
Hgb A1c MFr Bld: 6 % of total Hgb — ABNORMAL HIGH (ref <5.7)
Mean Plasma Glucose: 126 (calc)
eAG (mmol/L): 7 (calc)

## 2019-09-07 LAB — LIPID PANEL
Cholesterol: 223 mg/dL — ABNORMAL HIGH (ref <200)
HDL: 36 mg/dL — ABNORMAL LOW (ref >=40)
LDL Cholesterol (Calc): 153 mg/dL (calc) — ABNORMAL HIGH
Non-HDL Cholesterol (Calc): 187 mg/dL (calc) — ABNORMAL HIGH (ref <130)
Total CHOL/HDL Ratio: 6.2 (calc) — ABNORMAL HIGH (ref <5.0)
Triglycerides: 203 mg/dL — ABNORMAL HIGH (ref <150)

## 2019-09-07 LAB — MICROALBUMIN / CREATININE URINE RATIO
Creatinine, Urine: 200 mg/dL (ref 20–320)
Microalb Creat Ratio: 3 mcg/mg creat (ref <30)
Microalb, Ur: 0.5 mg/dL

## 2019-09-07 LAB — CBC WITH DIFFERENTIAL/PLATELET
Absolute Monocytes: 791 cells/uL (ref 200–950)
Basophils Absolute: 49 cells/uL (ref 0–200)
Basophils Relative: 0.7 %
Eosinophils Absolute: 301 cells/uL (ref 15–500)
Eosinophils Relative: 4.3 %
HCT: 48.7 % (ref 38.5–50.0)
Hemoglobin: 15.9 g/dL (ref 13.2–17.1)
Lymphs Abs: 1981 cells/uL (ref 850–3900)
MCH: 28.5 pg (ref 27.0–33.0)
MCHC: 32.6 g/dL (ref 32.0–36.0)
MCV: 87.4 fL (ref 80.0–100.0)
MPV: 11.8 fL (ref 7.5–12.5)
Monocytes Relative: 11.3 %
Neutro Abs: 3878 cells/uL (ref 1500–7800)
Neutrophils Relative %: 55.4 %
Platelets: 206 10*3/uL (ref 140–400)
RBC: 5.57 10*6/uL (ref 4.20–5.80)
RDW: 14.5 % (ref 11.0–15.0)
Total Lymphocyte: 28.3 %
WBC: 7 10*3/uL (ref 3.8–10.8)

## 2019-09-07 LAB — VITAMIN D 25 HYDROXY (VIT D DEFICIENCY, FRACTURES): Vit D, 25-Hydroxy: 12 ng/mL — ABNORMAL LOW (ref 30–100)

## 2019-09-07 LAB — PSA: PSA: 0.6 ng/mL (ref <=4.0)

## 2019-09-10 ENCOUNTER — Other Ambulatory Visit: Payer: Self-pay | Admitting: Family Medicine

## 2019-09-11 ENCOUNTER — Telehealth: Payer: Self-pay

## 2019-09-11 ENCOUNTER — Other Ambulatory Visit: Payer: Self-pay | Admitting: Family Medicine

## 2019-09-11 DIAGNOSIS — E785 Hyperlipidemia, unspecified: Secondary | ICD-10-CM

## 2019-09-11 MED ORDER — EZETIMIBE 10 MG PO TABS: 10.0000 mg | ORAL_TABLET | Freq: Every day | ORAL | 3 refills | Status: DC

## 2019-09-11 NOTE — Progress Notes (Signed)
Left message for Patient to return call regarding lab results.

## 2019-09-11 NOTE — Telephone Encounter (Signed)
Pt given lab results per notes of Dr. Carlynn Purl on 09/10/19. Pt verbalized understanding. He says he takes the Rosuvastatin as prescribed daily. Advised Vit D Rx is at pharmacy.    Isaac Cory, MD  09/10/2019 7:06 PM EDT     Lipid panel is worse, bad cholesterol has gone up and also triglycerides level , I will add Zetia if he has been taking Rosuvastatin daily  Sugar, kidney and liver function tests are within normal limits One of the protein levels is elevated, but we will monitor for now Normal CBC- no anemia Normal PSA Vitamin D is low and I will send a rx to his pharmacy  A1C is at goal , urine micro is normal

## 2019-09-29 IMAGING — US SOFT TISSUE ULTRASOUND HEAD/NECK
1 series · 10 of 10 positions shown · non-contrast
Comparison: None.

CLINICAL DATA: Mass right side of neck

EXAM:
ULTRASOUND OF HEAD/NECK SOFT TISSUES
TECHNIQUE: Ultrasound examination of the head and neck soft tissues was
performed in the area of clinical concern.

[Series 1: soft tissue ultrasound head/neck · 0.07mm/px · 10 of 10 slices shown]
[im 1/10]
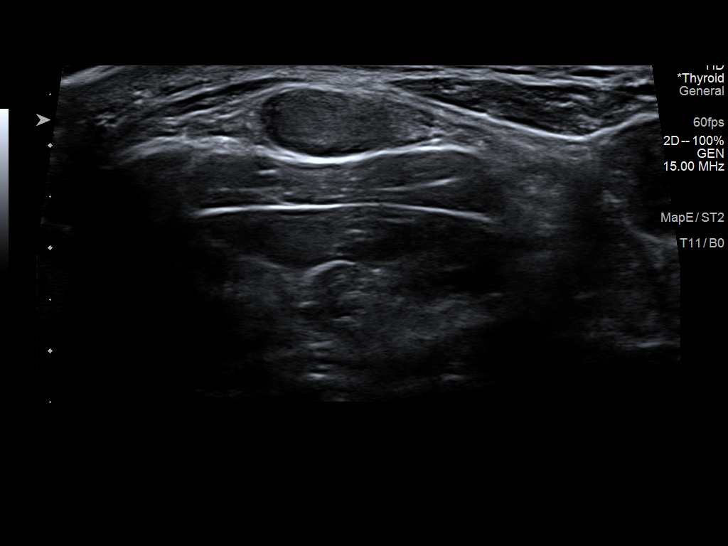
[im 2/10]
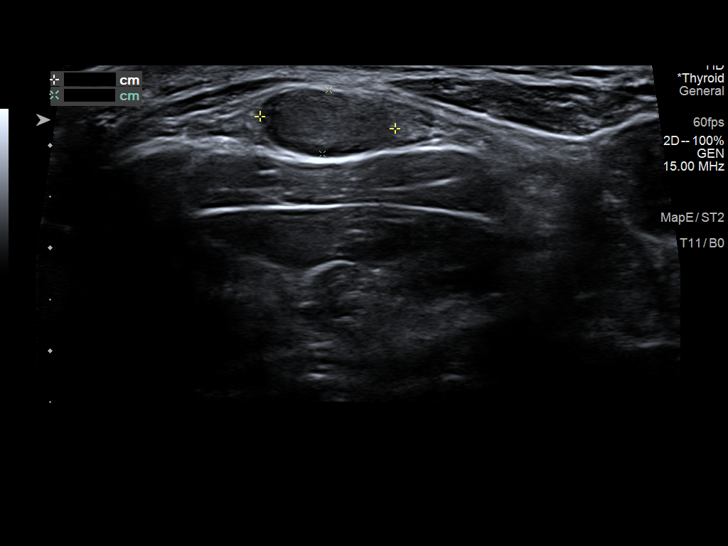
[im 3/10]
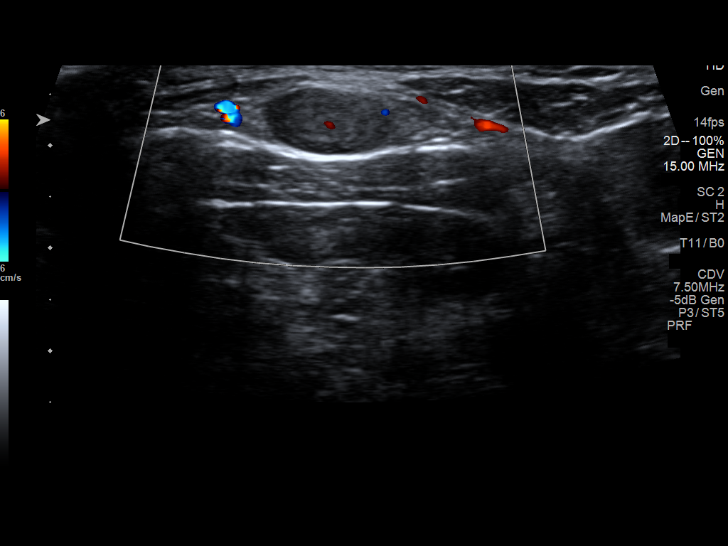
[im 4/10]
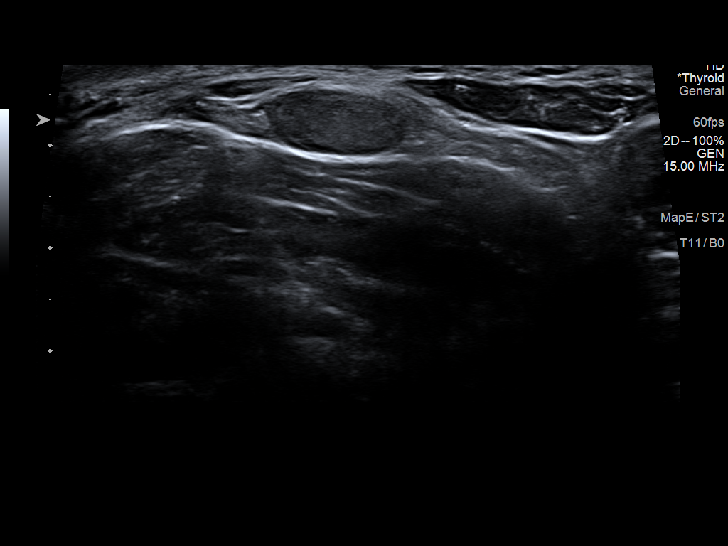
[im 5/10]
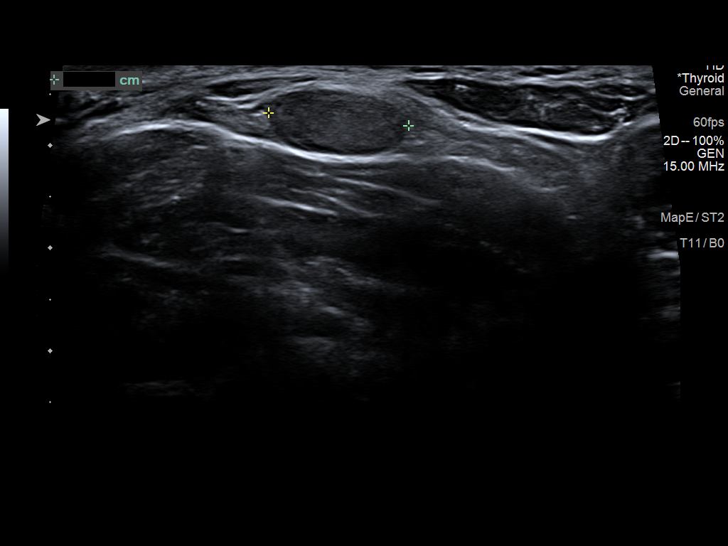
[im 6/10]
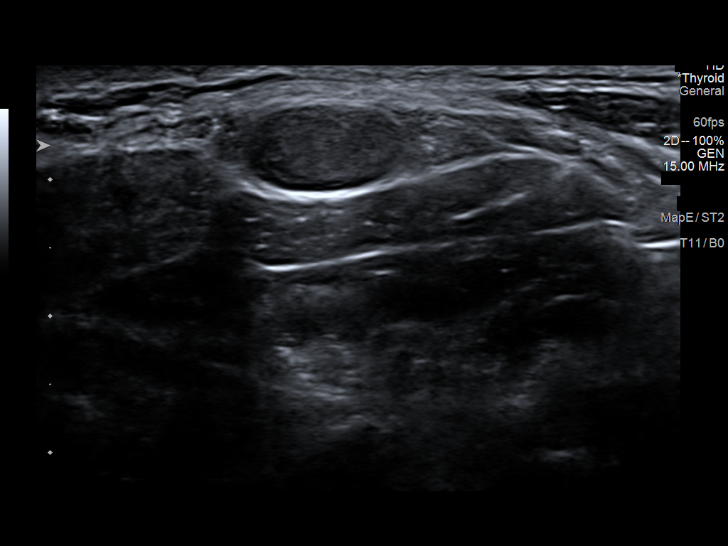
[im 7/10]
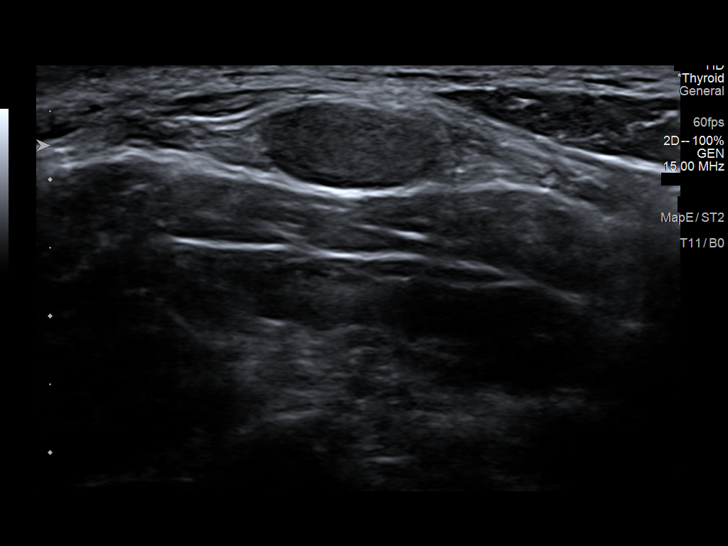
[im 8/10]
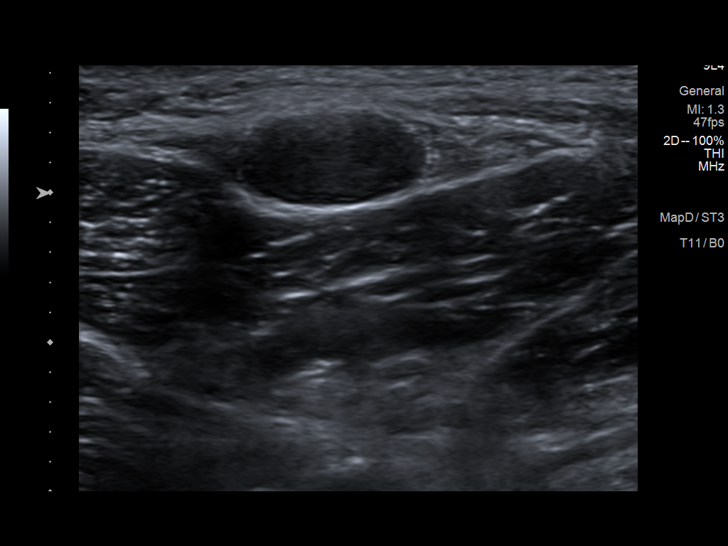
[im 9/10]
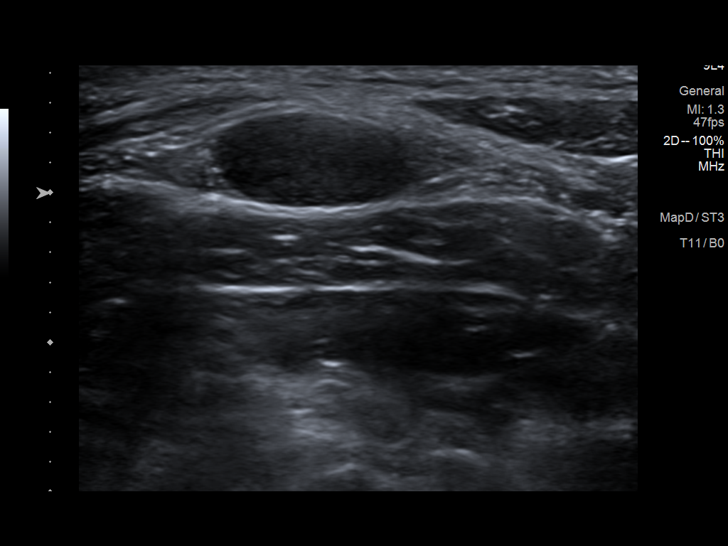
[im 10/10]
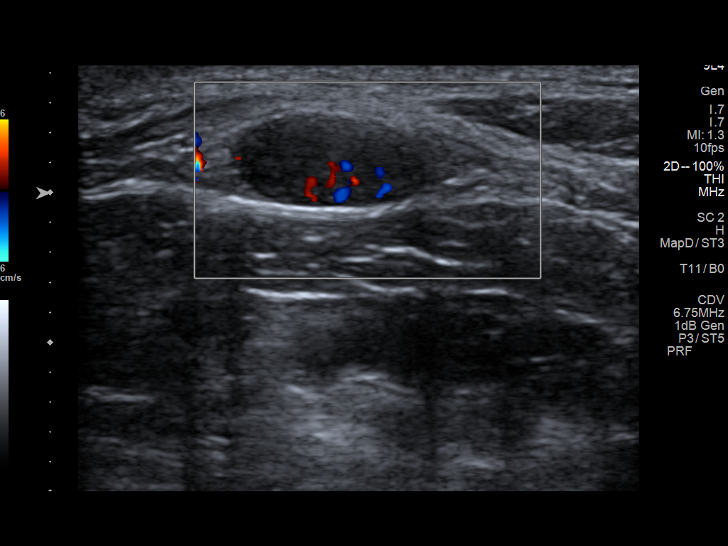

[10 of 10 positions shown; findings below may reference images not displayed]

FINDINGS: Adjacent to the angle of the mandible on the right, there is a 1.3 x
1.4 x 0.6 cm ovoid mass with blood flow and homogeneous echotexture.
Although the short axis diameter is only 6 mm, this does not look
like a normal lymph node. Therefore, consider neck CT with contrast
complete evaluation.
IMPRESSION: 1.3 x 1.4 x 0.6 cm ovoid mass adjacent to the angle of the mandible
on the right. Although short axis diameter is only 6 mm, the
appearance is not that of a normal lymph node. Therefore, consider
neck CT with contrast.

## 2019-10-10 ENCOUNTER — Other Ambulatory Visit: Payer: Self-pay

## 2019-10-10 DIAGNOSIS — E785 Hyperlipidemia, unspecified: Secondary | ICD-10-CM

## 2019-10-19 IMAGING — CT CT NECK WITH CONTRAST
5 series · 16 of 33 positions shown, 18 images · IV contrast (omnipaque)
Comparison: None.

CLINICAL DATA: Mass, solitary. Right posterior neck not for 1 year
without reported change

EXAM:
CT NECK WITH CONTRAST
TECHNIQUE: Multidetector CT imaging of the neck was performed using the
standard protocol following the bolus administration of intravenous
contrast.
CONTRAST:  75mL OMNIPAQUE IOHEXOL 300 MG/ML  SOLN

[Series 2: axial neck · axial · 0.56mm/px · z∈[-784,-652]mm · 3 of 132 slices shown, 4 images]
[im 33/132  soft-tissue]
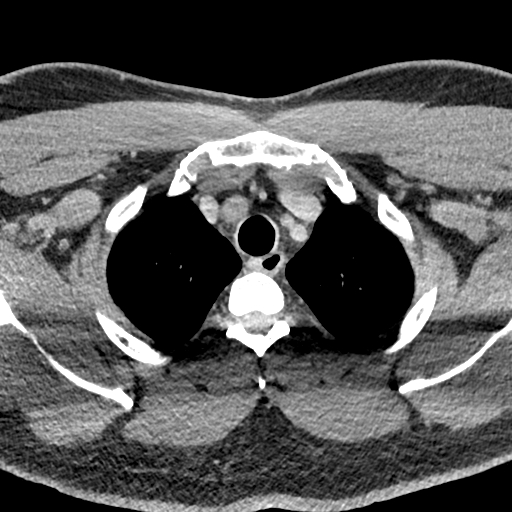
[im 33/132  bone]
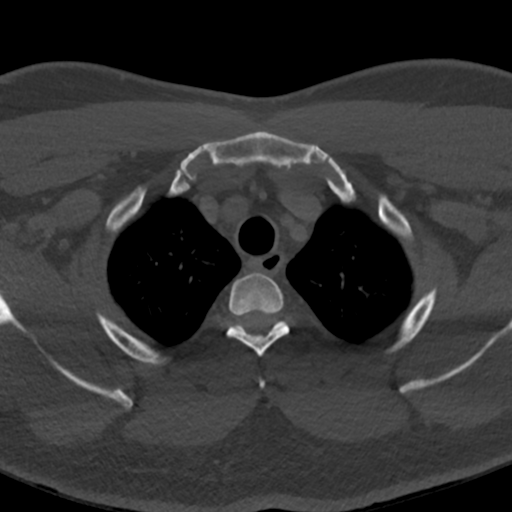
[im 66/132  bone]
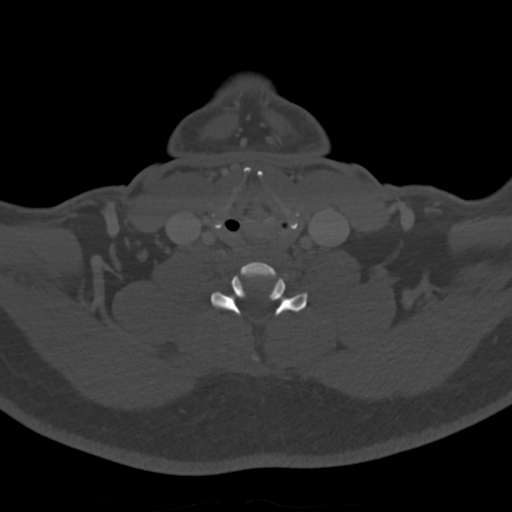
[im 99/132  bone]
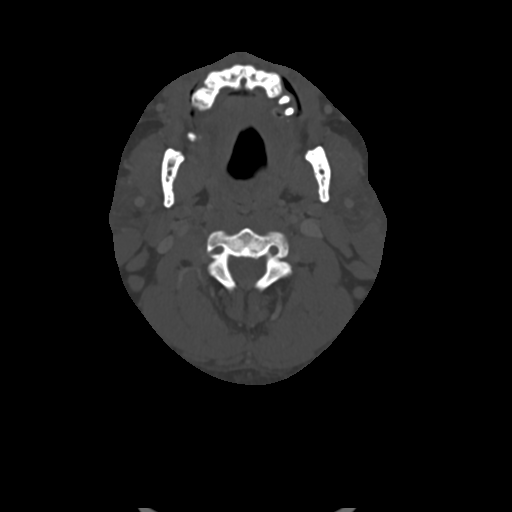

[Series 3: axial bone neck · axial · 0.56mm/px · z∈[-762,-674]mm · 2 of 132 slices shown]
[im 44/132  bone]
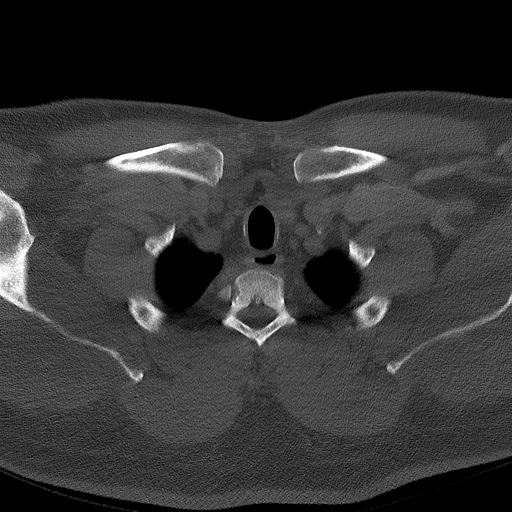
[im 88/132  bone]
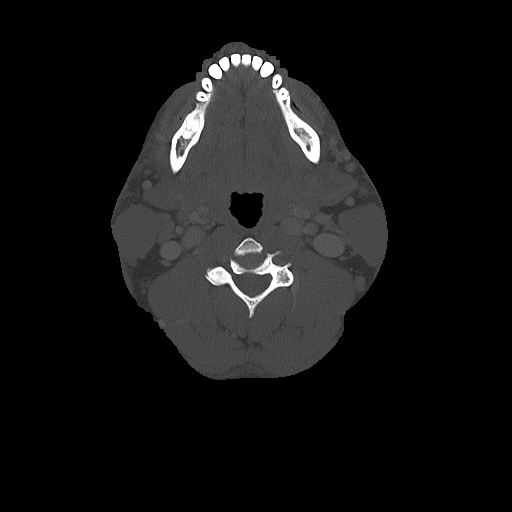

[Series 4: coronal neck · coronal · 0.58mm/px · 3 of 144 slices shown]
[im 29/144  bone]
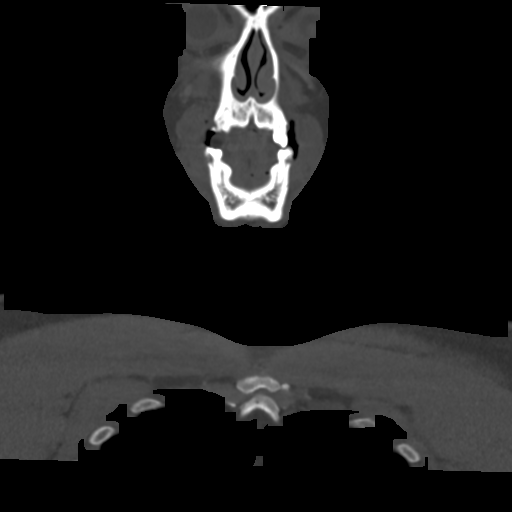
[im 58/144  bone]
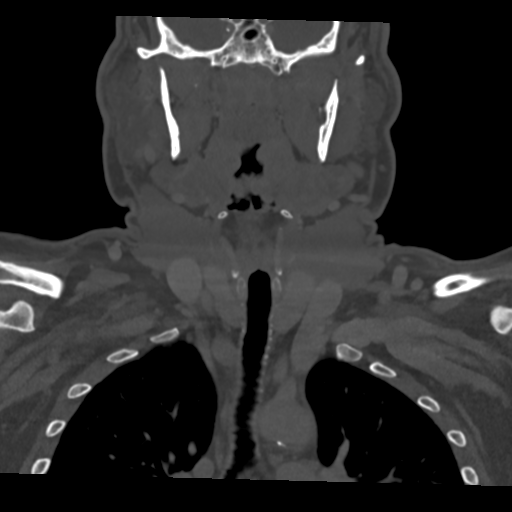
[im 86/144  bone]
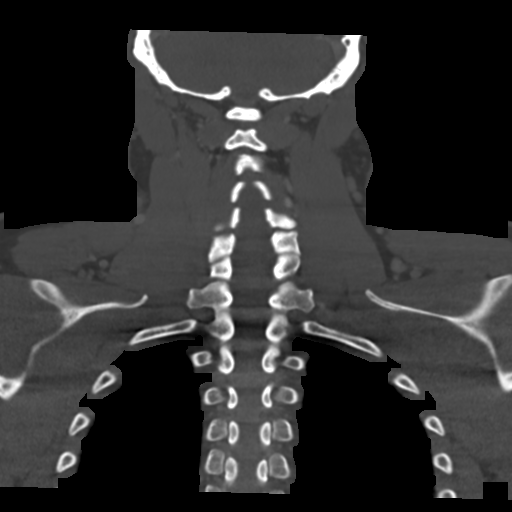

[Series 6: sagittal neck · sagittal · 0.56mm/px · 5 of 149 slices shown, 6 images]
[im 50/149  bone]
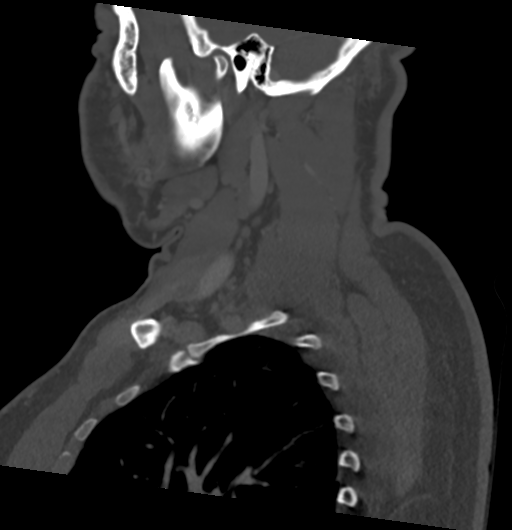
[im 62/149  bone]
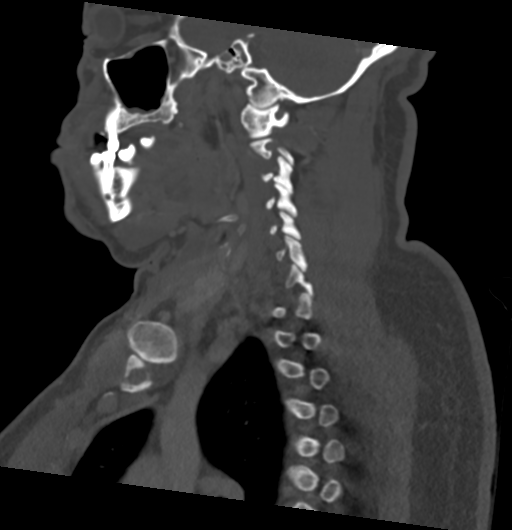
[im 75/149  soft-tissue]
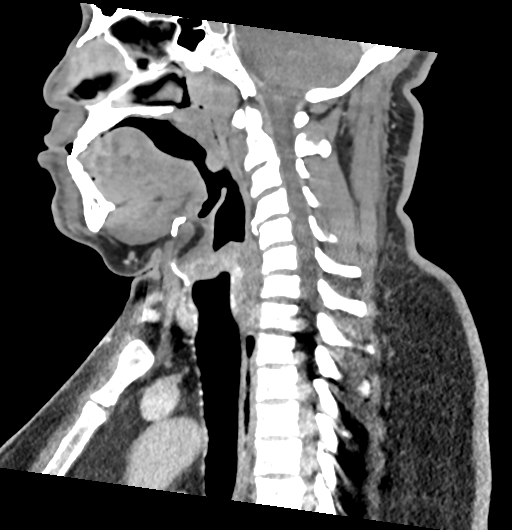
[im 75/149  bone]
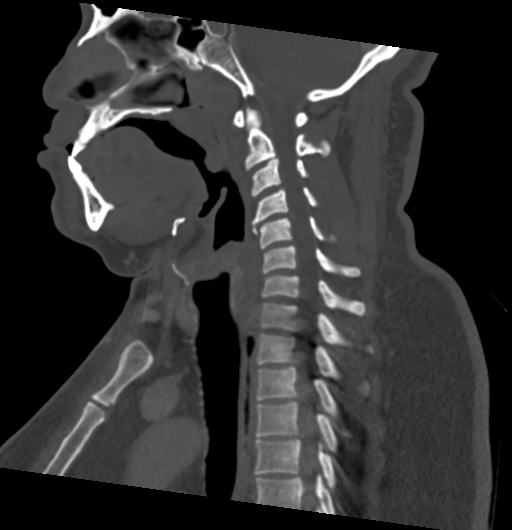
[im 87/149  bone]
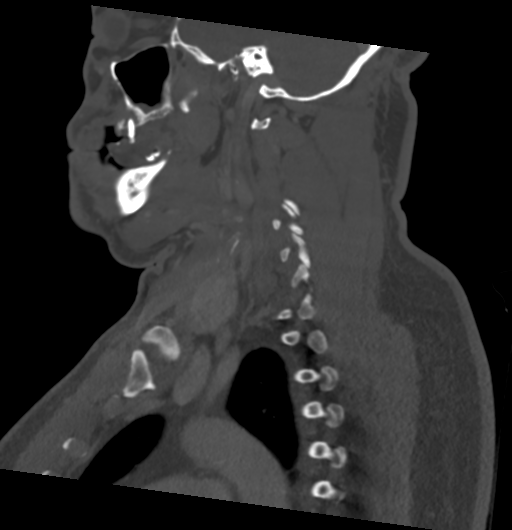
[im 99/149  bone]
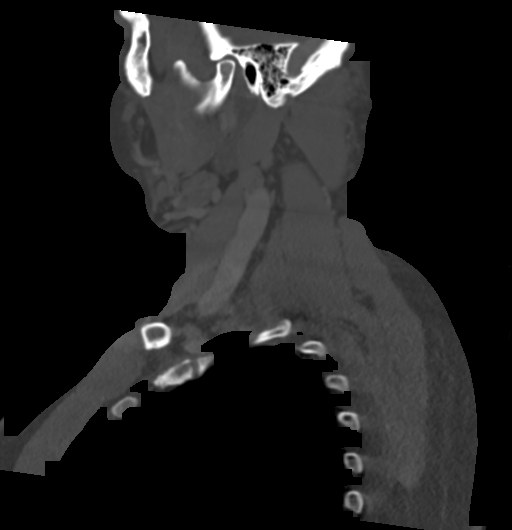

[Series 8: ax oropharynx neck · axial · 0.56mm/px · z∈[-814,-668]mm · 3 of 149 slices shown]
[im 38/149  bone]
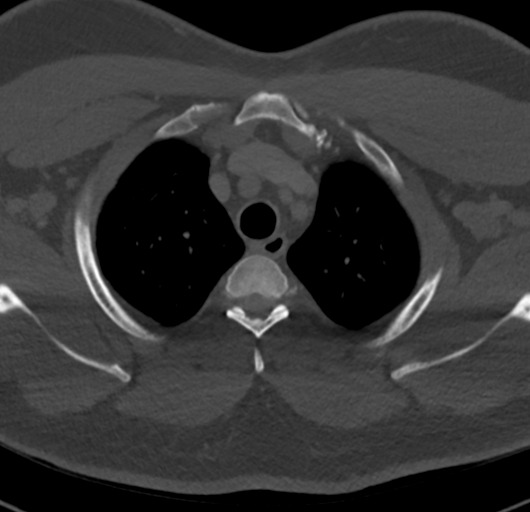
[im 75/149  bone]
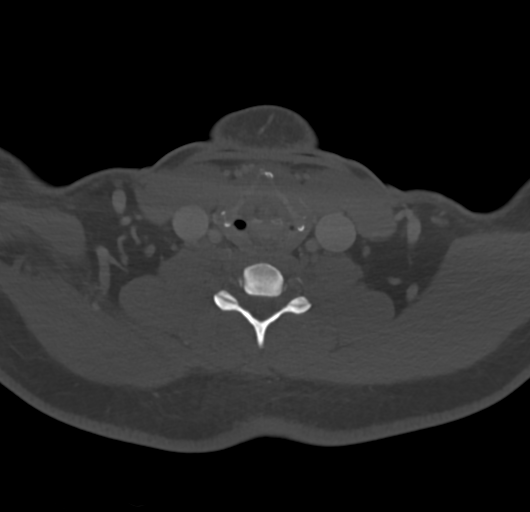
[im 112/149  bone]
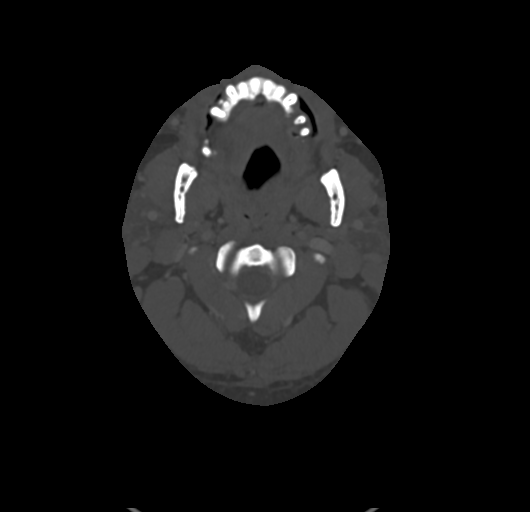

[16 of 33 positions shown; findings below may reference images not displayed]

FINDINGS: Pharynx and larynx: Mild thickening of tonsils. No superimposed mass
or edema/swelling.

Salivary glands: No inflammation, mass, or stone.

Thyroid: Normal.

Lymph nodes: Palpable marker over the right neck reflects a mildly
enlarged lymph node at the posterior margin of the
sternocleidomastoid. The node is 1 of multiple symmetric mildly
enlarged lymph nodes in the jugular chains. No nodal cavitation,
calcification or surrounding fat inflammation.

Vascular: Negative

Limited intracranial: Negative

Visualized orbits: Negative

Mastoids and visualized paranasal sinuses: Clear

Skeleton: No acute or aggressive finding

Upper chest: Negative
IMPRESSION: Palpable complaint in the right neck reflects a 12 mm lymph node.
The node is 1 of multiple mildly enlarged nodes in the bilateral
neck. These are nonspecific and could be reactive; the tonsils are
also somewhat hypertrophic for age. Recommend correlation for any
systemic illness and recommend clinical follow-up.

## 2020-01-06 IMAGING — US US BIOPSY LYMPH NODE
1 series · 8 of 8 positions shown · non-contrast
Comparison: none

INDICATION: 53-year-old male with a persistent palpable lymph node in the
posterior right cervical region. He presents for ultrasound-guided
core biopsy.

[Series 1: us biopsy lymph node · 0.05mm/px · 8 of 8 slices shown]
[im 1/8]
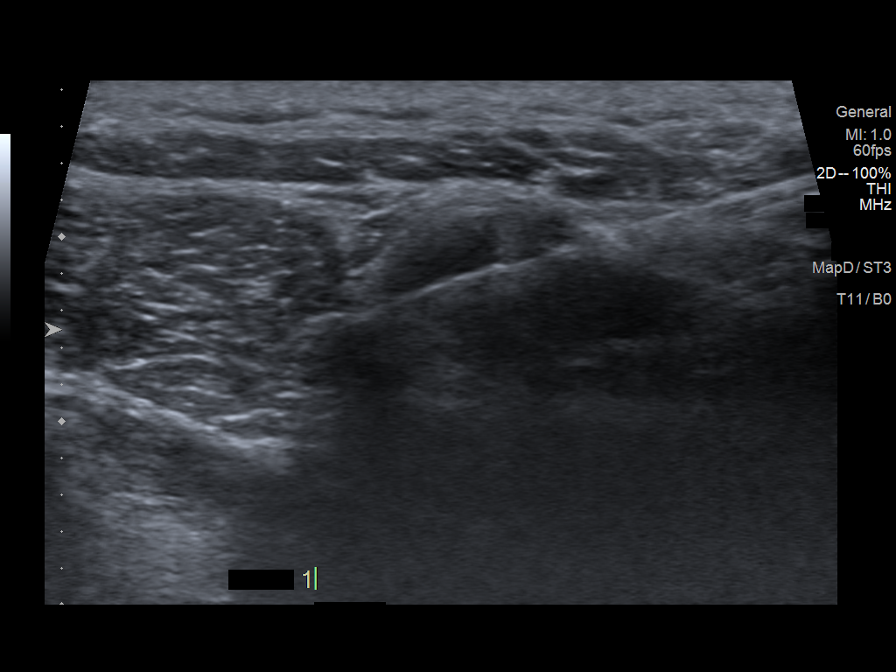
[im 2/8]
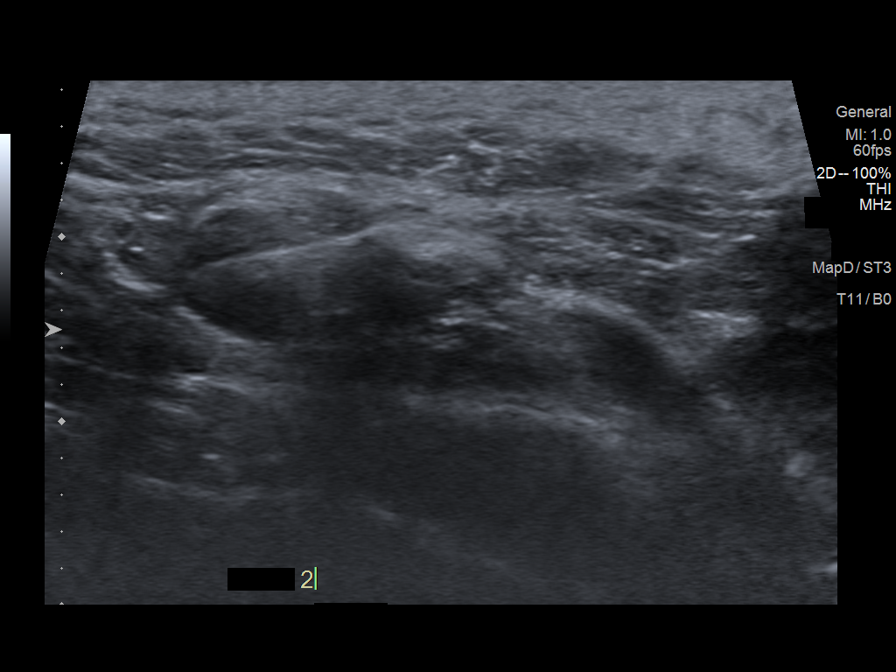
[im 3/8]
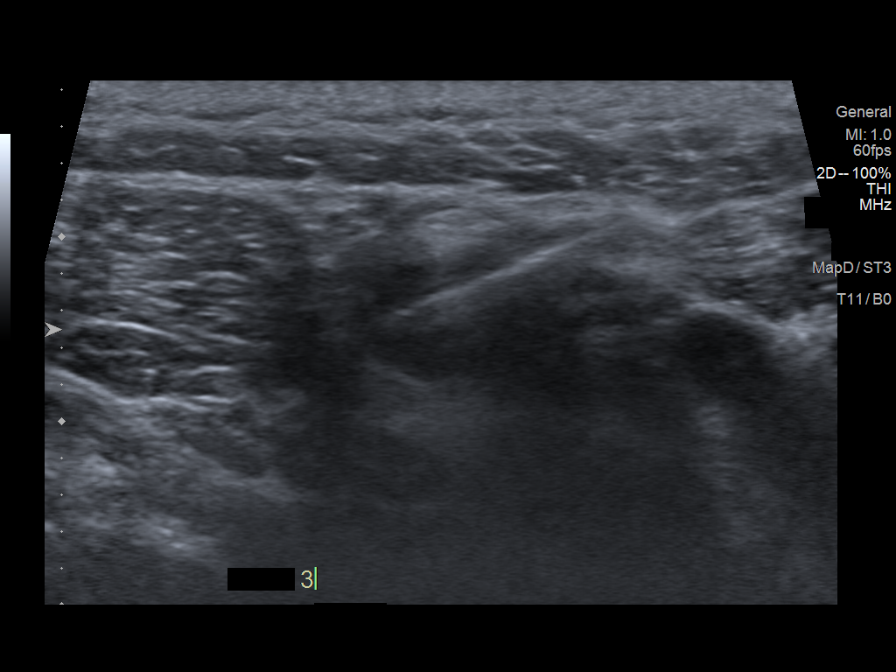
[im 4/8]
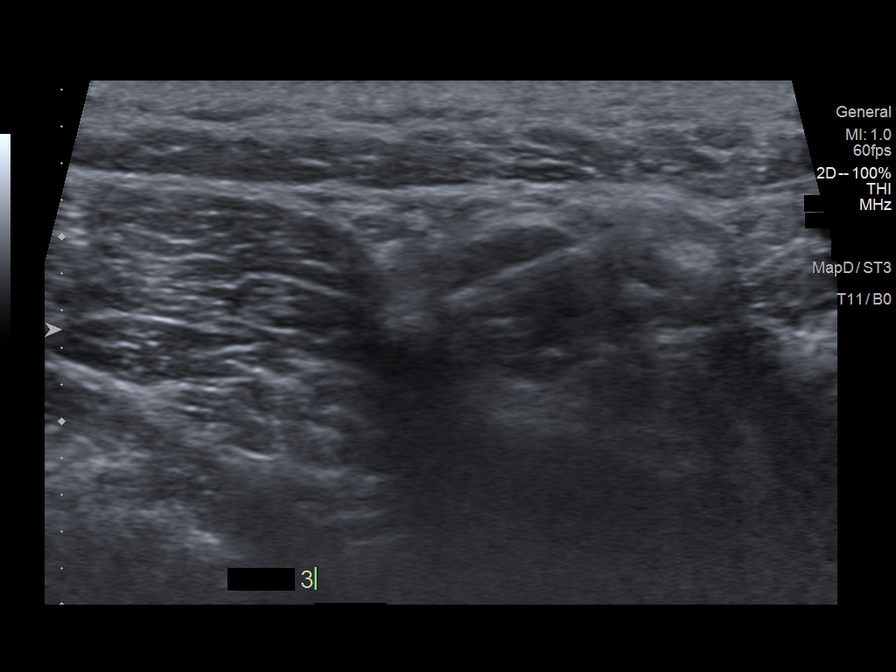
[im 5/8]
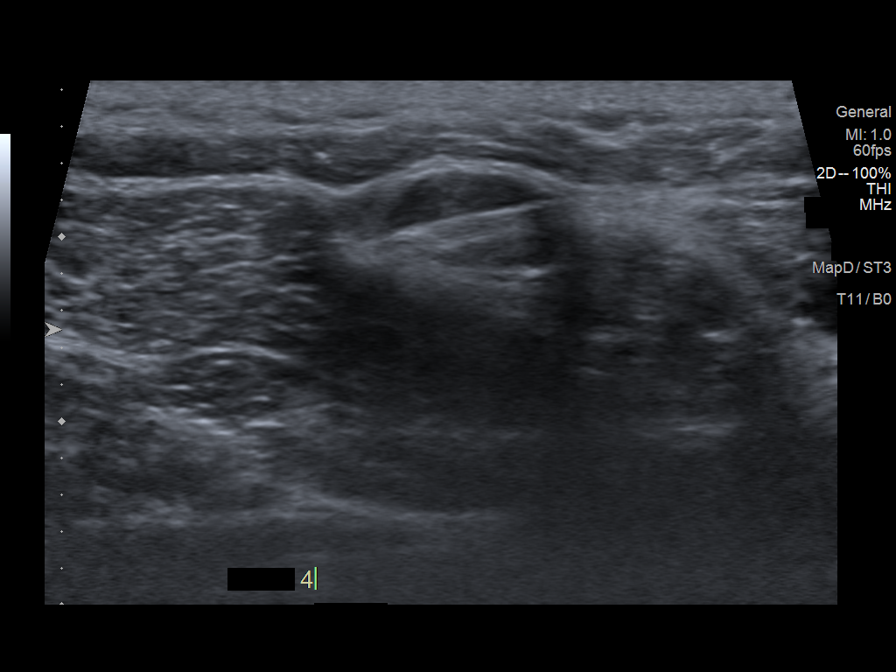
[im 6/8]
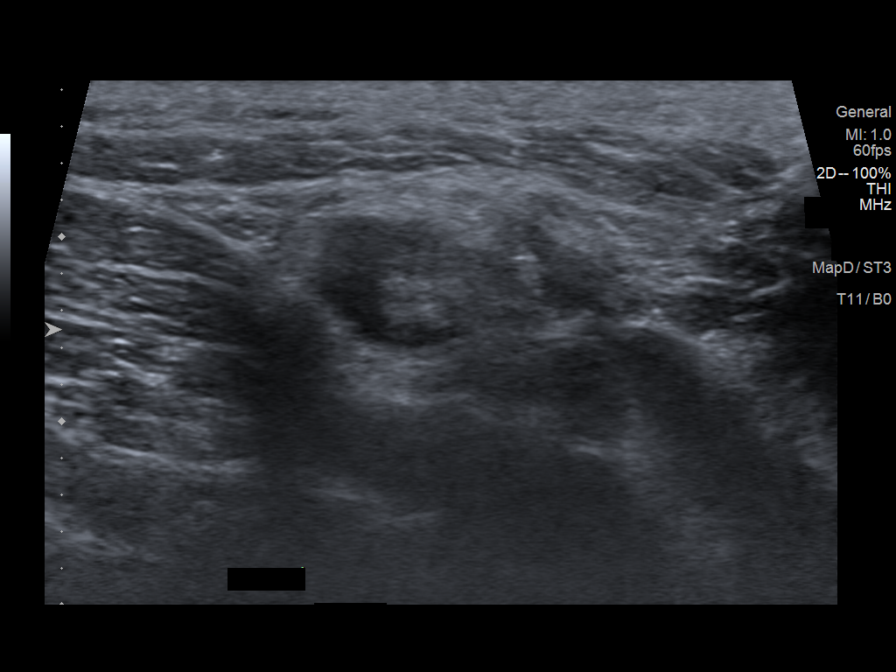
[im 7/8]
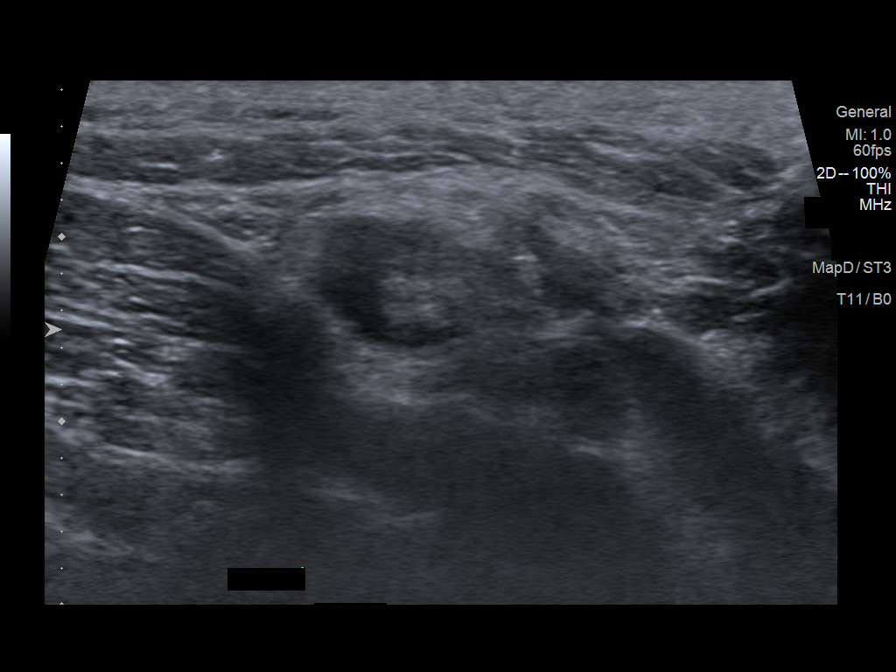
[im 8/8]
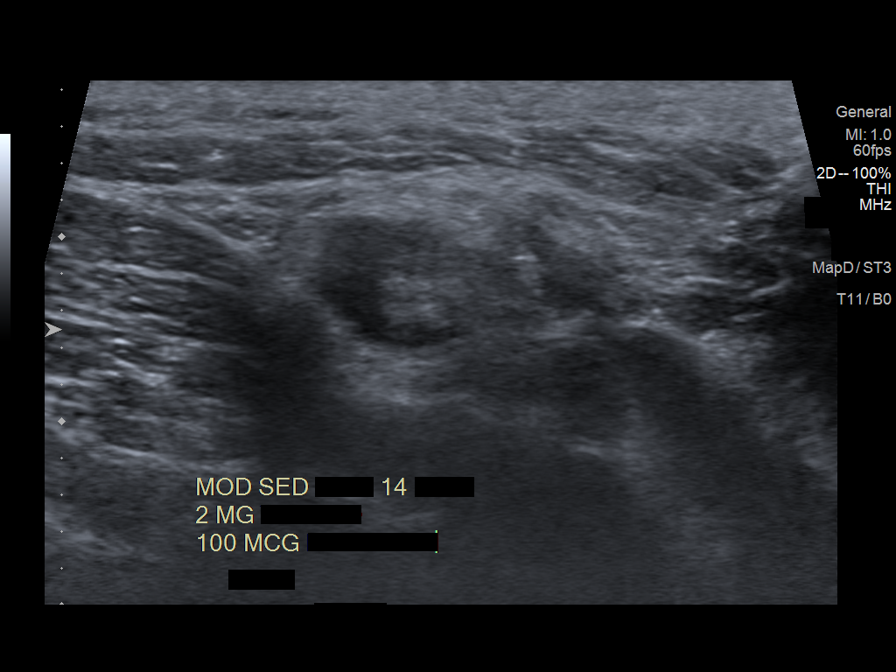

[8 of 8 positions shown; findings below may reference images not displayed]

EXAM:
ULTRASOUND OF THE LYMPH NODES

MEDICATIONS:
None.

ANESTHESIA/SEDATION:
Moderate (conscious) sedation was employed during this procedure. A
total of Versed 2 mg and Fentanyl 100 mcg was administered
intravenously.

Moderate Sedation Time: 14 minutes. The patient's level of
consciousness and vital signs were monitored continuously by
radiology nursing throughout the procedure under my direct
supervision.

FLUOROSCOPY TIME:  None.

COMPLICATIONS:
None immediate.

PROCEDURE:
Informed written consent was obtained from the patient after a
thorough discussion of the procedural risks, benefits and
alternatives. All questions were addressed. A timeout was performed
prior to the initiation of the procedure.

Sonographic survey of the region of clinical concern demonstrates a
small hypoechoic lymph node measuring approximately 0.9 x 0.7 cm.

A suitable skin entry site was selected and marked. The overlying
skin was sterilely prepped and draped in the standard fashion using
chlorhexidine skin prep. Local anesthesia was attained by
infiltration with 1% lidocaine. A small dermatotomy was made. Under
real-time ultrasound guidance, multiple 18 gauge core biopsies were
obtained using the Diomdes Vera automated biopsy device. Biopsy
samples were placed on a moistened Telfa and delivered to pathology
for further analysis. Post biopsy ultrasound imaging demonstrates no
evidence of immediate complication. The patient tolerated the
procedure well.
IMPRESSION: Technically successful ultrasound-guided core biopsy of right
posterior cervical lymph node.

## 2020-03-07 NOTE — Progress Notes (Unsigned)
Name: Isaac Delacruz   MRN: [id number]    DOB: Jun 15, 1965   Date:03/08/2020       Progress Note  Subjective  Chief Complaint  Follow Up  HPI  MorbidObesity: he states doing a little better, he is still drinking sodas while at work to stay awake- 40 oz per night. Discussed switching to coke zero, continue trying to avoid desserts   HTN:he is now taking bp medication daily bp today is slightly higher than normal, but still within normal limits. He denies  chest pain, dizziness  or palpitation . He worked last night   Dyslipidemia: Last LDL was 153 , HDL HDL still low at 36 , we switched from Atorvastatin to Rosuvastatin Feb 2021 but still not at goal , we added Zetia on his last visit.   Pre-diabetes: he denies polyphagia, polydipsia or polyuria. He is back on Metformin since July 2020. A1C back in August was 6 %. We will recheck labs today   [place]: [place], he states skin itches a lot a night and doing better with loratadine and hydroxizine, no problems at this time. He is only on topical medication now and doing well. He still takes hydroxyzine prn for itching   Cervical lymphadenopathy: with abnormal [place] and had CT neck, referred to Dr. Lady Gary, she did not recommend a biopsy or removal, he saw Dr. Genevive Bi and biopsy was done 12/2018 and it was reactive lymphadenopathy, he states size is unchanged   OSA: wearing CPAP most days, he has been very compliant lately   Patient Active Problem List   Diagnosis Date Noted  . Enlarged lymph node 09/22/2018  . Seasonal allergic rhinitis 08/18/2016  . Folliculitis 05/23/2015  . Allergic dermatitis 05/23/2015  . History of Bell's palsy 07/18/2014  . Benign essential HTN 07/18/2014  . Dyslipidemia 07/18/2014  . LBP (low back pain) 07/18/2014  . Vitamin D deficiency 07/18/2014  . Dysmetabolic syndrome 07/18/2014  . Microalbuminuria 07/18/2014  . Extreme obesity 07/18/2014  .  Obstructive apnea 07/18/2014  . Tobacco use 07/18/2014    Past Surgical History:  Procedure Laterality Date  . COLONOSCOPY    . COLONOSCOPY WITH PROPOFOL N/A 11/11/2016   Procedure: COLONOSCOPY WITH PROPOFOL;  Surgeon: Toney Reil, MD;  Location: [facility];  Service: Endoscopy;  Laterality: N/A;  sleep apnea    Family History  Problem Relation Age of Onset  . Cancer Mother 81       Lung  . Cancer Father        Prostate  . Diabetes Father   . Hypertension Brother   . Cancer Maternal Aunt        Lung  . Heart disease Brother     Social History   Tobacco Use  . Smoking status: Current Some Day Smoker    Years: 22.00    Types: Cigars    Start date: 10/12/1997  . Smokeless tobacco: Never Used  . Tobacco comment: Smokes 2 cigars daily  Substance Use Topics  . Alcohol use: Yes    Alcohol/week: 0.0 standard drinks    Comment: rarely     Current Outpatient Medications:  .  aspirin 81 MG tablet, Take by mouth., Disp: , Rfl:  .  ezetimibe (ZETIA) 10 MG tablet, Take 1 tablet (10 mg total) by mouth daily. With Rosuvastatin for cholesterol, Disp: 90 tablet, Rfl: 3 .  fluticasone (FLONASE) 50 MCG/ACT nasal spray, Place 2 sprays into both nostrils daily., Disp: 16 g,  Rfl: 2 .  hydrOXYzine (ATARAX/VISTARIL) 25 MG tablet, Take 1 tablet (25 mg total) by mouth 2 (two) times daily as needed., Disp: 180 tablet, Rfl: 1 .  lisinopril (ZESTRIL) 10 MG tablet, Take 1 tablet (10 mg total) by mouth daily., Disp: 90 tablet, Rfl: 1 .  loratadine (CLARITIN) 10 MG tablet, Take 1 tablet (10 mg total) by mouth daily., Disp: 90 tablet, Rfl: 1 .  metFORMIN (GLUCOPHAGE) 500 MG tablet, Take 1 tablet (500 mg total) by mouth 2 (two) times daily with a meal., Disp: 180 tablet, Rfl: 1 .  modafinil (PROVIGIL) 100 MG tablet, Take 1 tablet (100 mg total) by mouth daily., Disp: 30 tablet, Rfl: 2 .  omeprazole (PRILOSEC) 40 MG capsule, Take 40 mg by mouth daily., Disp: , Rfl:  .  rosuvastatin  (CRESTOR) 40 MG tablet, Take 1 tablet (40 mg total) by mouth daily. In place of Atorvastatin, Disp: 90 tablet, Rfl: 1 .  Vitamin D, Ergocalciferol, (DRISDOL) 1.25 MG (50000 UNIT) CAPS capsule, Take 1 capsule (50,000 Units total) by mouth every 7 (seven) days., Disp: 12 capsule, Rfl: 1  No Known Allergies  I personally reviewed active problem list, medication list, allergies, family history, social history, health maintenance with the patient/caregiver today.   ROS  Constitutional: Negative for fever or weight change.  Respiratory: Negative for cough and shortness of breath.   Cardiovascular: Negative for chest pain or palpitations.  Gastrointestinal: Negative for abdominal pain, no bowel changes.  Musculoskeletal: Negative for gait problem or joint swelling.  Skin: positive  for rash.  Neurological: Negative for dizziness or headache.  No other specific complaints in a complete review of systems (except as listed in HPI above).  Objective  Vitals:   03/08/20 0736  BP: 136/88  Pulse: 89  Resp: 16  Temp: 98.1 F (36.7 C)  TempSrc: Oral  SpO2: 100%  Weight: 255 lb 14.4 oz (116.1 kg)  Height: 5\' 4"  (1.626 m)    Body mass index is 43.93 kg/m.  Physical Exam  Constitutional: Patient appears well-developed and well-nourished. Obese  No distress.  HEENT: head atraumatic, normocephalic, pupils equal and reactive to light, neck supple Cardiovascular: Normal rate, regular rhythm and normal heart sounds.  No murmur heard. No BLE edema. Pulmonary/Chest: Effort normal and breath sounds normal. No respiratory distress. Abdominal: Soft.  There is no tenderness. Skin: hypopigmented areas on scalp, eczematous patches on both arms  Psychiatric: Patient has a normal mood and affect. behavior is normal. Judgment and thought content normal.   PHQ2/9: Depression screen [facility] 2/9 03/08/2020 09/06/2019 03/27/2019 11/23/2018 08/23/2018  Decreased Interest 0 0 0 0 0  Down, Depressed, Hopeless 0 0 0 0  0  PHQ - 2 Score 0 0 0 0 0  Altered sleeping - 0 0 0 0  Tired, decreased energy - 0 0 0 0  Change in appetite - 0 0 0 0  Feeling bad or failure about yourself  - 0 0 0 0  Trouble concentrating - 0 0 0 0  Moving slowly or fidgety/restless - 0 0 0 0  Suicidal thoughts - 0 0 0 0  PHQ-9 Score - 0 0 0 0  Difficult doing work/chores - Not difficult at all Not difficult at all - -    phq 9 is negative   Fall Risk: Fall Risk  03/08/2020 09/06/2019 03/27/2019 11/23/2018 08/23/2018  Falls in the past year? 0 0 0 0 0  Number falls in past yr: 0 0 0 0 0  Injury with Fall? 0 0 0 0 0  Follow up - Falls evaluation completed - - -     Functional Status Survey: Is the patient deaf or have difficulty hearing?: No Does the patient have difficulty seeing, even when wearing glasses/contacts?: No Does the patient have difficulty concentrating, remembering, or making decisions?: No Does the patient have difficulty walking or climbing stairs?: No Does the patient have difficulty dressing or bathing?: No Does the patient have difficulty doing errands alone such as visiting a doctor's office or shopping?: No    Assessment & Plan  1. Benign essential HTN  - COMPLETE METABOLIC PANEL WITH GFR - lisinopril (ZESTRIL) 10 MG tablet; Take 1 tablet (10 mg total) by mouth daily.  Dispense: 90 tablet; Refill: 1  2. Dysmetabolic syndrome  - Hemoglobin A1c - metFORMIN (GLUCOPHAGE) 500 MG tablet; Take 1 tablet (500 mg total) by mouth 2 (two) times daily with a meal.  Dispense: 180 tablet; Refill: 1  3. Microalbuminuria  - lisinopril (ZESTRIL) 10 MG tablet; Take 1 tablet (10 mg total) by mouth daily.  Dispense: 90 tablet; Refill: 1  4. Obstructive apnea   5. Need for immunization against influenza  - Flu Vaccine QUAD 36+ mos IM  6. Vitamin D deficiency  - Vitamin D, Ergocalciferol, (DRISDOL) 1.25 MG (50000 UNIT) CAPS capsule; Take 1 capsule (50,000 Units total) by mouth every 7 (seven) days.  Dispense:  12 capsule; Refill: 1  7. Morbid obesity with BMI of 45.0-49.9, adult [facility])  Discussed with the patient the risk posed by an increased BMI. Discussed importance of portion control, calorie counting and at least 150 minutes of physical activity weekly. Avoid sweet beverages and drink more water. Eat at least 6 servings of fruit and vegetables daily   8. Shift work sleep disorder   9. Dyslipidemia  - Lipid panel - rosuvastatin (CRESTOR) 40 MG tablet; Take 1 tablet (40 mg total) by mouth daily. In place of Atorvastatin  Dispense: 90 tablet; Refill: 1  10. Allergic dermatitis  - hydrOXYzine (ATARAX/VISTARIL) 25 MG tablet; Take 1 tablet (25 mg total) by mouth 2 (two) times daily as needed.  Dispense: 180 tablet; Refill: 1 - loratadine (CLARITIN) 10 MG tablet; Take 1 tablet (10 mg total) by mouth daily.  Dispense: 90 tablet; Refill: 1

## 2020-03-08 ENCOUNTER — Encounter: Payer: Self-pay | Admitting: Family Medicine

## 2020-03-08 ENCOUNTER — Ambulatory Visit: Payer: No Typology Code available for payment source | Admitting: Family Medicine

## 2020-03-08 ENCOUNTER — Other Ambulatory Visit: Payer: Self-pay

## 2020-03-08 VITALS — BP 136/88 | HR 89 | Temp 98.1°F | Resp 16 | Ht 64.0 in | Wt 255.9 lb

## 2020-03-08 DIAGNOSIS — R809 Proteinuria, unspecified: Secondary | ICD-10-CM

## 2020-03-08 DIAGNOSIS — E559 Vitamin D deficiency, unspecified: Secondary | ICD-10-CM

## 2020-03-08 DIAGNOSIS — I10 Essential (primary) hypertension: Secondary | ICD-10-CM

## 2020-03-08 DIAGNOSIS — G4733 Obstructive sleep apnea (adult) (pediatric): Secondary | ICD-10-CM

## 2020-03-08 DIAGNOSIS — E8881 Metabolic syndrome: Secondary | ICD-10-CM | POA: Diagnosis not present

## 2020-03-08 DIAGNOSIS — Z6841 Body Mass Index (BMI) 40.0 and over, adult: Secondary | ICD-10-CM

## 2020-03-08 DIAGNOSIS — G4726 Circadian rhythm sleep disorder, shift work type: Secondary | ICD-10-CM

## 2020-03-08 DIAGNOSIS — E785 Hyperlipidemia, unspecified: Secondary | ICD-10-CM

## 2020-03-08 DIAGNOSIS — Z23 Encounter for immunization: Secondary | ICD-10-CM | POA: Diagnosis not present

## 2020-03-08 DIAGNOSIS — L239 Allergic contact dermatitis, unspecified cause: Secondary | ICD-10-CM

## 2020-03-08 MED ORDER — HYDROXYZINE HCL 25 MG PO TABS: 25.0000 mg | ORAL_TABLET | Freq: Two times a day (BID) | ORAL | 1 refills | Status: DC | PRN

## 2020-03-08 MED ORDER — ROSUVASTATIN CALCIUM 40 MG PO TABS
40.0000 mg | ORAL_TABLET | Freq: Every day | ORAL | 1 refills | Status: DC
Start: 2020-03-08 — End: 2020-12-13

## 2020-03-08 MED ORDER — VITAMIN D (ERGOCALCIFEROL) 1.25 MG (50000 UNIT) PO CAPS
50000.0000 [IU] | ORAL_CAPSULE | ORAL | 1 refills | Status: DC
Start: 2020-03-08 — End: 2020-12-13

## 2020-03-08 MED ORDER — METFORMIN HCL 500 MG PO TABS: 500.0000 mg | ORAL_TABLET | Freq: Two times a day (BID) | ORAL | 1 refills | Status: DC

## 2020-03-08 MED ORDER — LORATADINE 10 MG PO TABS: 10.0000 mg | ORAL_TABLET | Freq: Every day | ORAL | 1 refills | Status: DC

## 2020-03-08 MED ORDER — LISINOPRIL 10 MG PO TABS: 10.0000 mg | ORAL_TABLET | Freq: Every day | ORAL | 1 refills | Status: DC

## 2020-03-09 LAB — COMPLETE METABOLIC PANEL WITH GFR
AG Ratio: 1.3 (calc) (ref 1.0–2.5)
ALT: 10 U/L (ref 9–46)
AST: 18 U/L (ref 10–35)
Albumin: 4.2 g/dL (ref 3.6–5.1)
Alkaline phosphatase (APISO): 67 U/L (ref 35–144)
BUN: 16 mg/dL (ref 7–25)
CO2: 27 mmol/L (ref 20–32)
Calcium: 9.4 mg/dL (ref 8.6–10.3)
Chloride: 105 mmol/L (ref 98–110)
Creat: 1.05 mg/dL (ref 0.70–1.33)
GFR, Est African American: 94 mL/min/{1.73_m2} (ref >=60)
GFR, Est Non African American: 81 mL/min/{1.73_m2} (ref >=60)
Globulin: 3.3 g/dL (calc) (ref 1.9–3.7)
Glucose, Bld: 96 mg/dL (ref 65–99)
Potassium: 4.5 mmol/L (ref 3.5–5.3)
Sodium: 138 mmol/L (ref 135–146)
Total Bilirubin: 0.6 mg/dL (ref 0.2–1.2)
Total Protein: 7.5 g/dL (ref 6.1–8.1)

## 2020-03-09 LAB — HEMOGLOBIN A1C
Hgb A1c MFr Bld: 6 % of total Hgb — ABNORMAL HIGH (ref <5.7)
Mean Plasma Glucose: 126 mg/dL
eAG (mmol/L): 7 mmol/L

## 2020-03-09 LAB — LIPID PANEL
Cholesterol: 209 mg/dL — ABNORMAL HIGH (ref <200)
HDL: 31 mg/dL — ABNORMAL LOW (ref >=40)
LDL Cholesterol (Calc): 144 mg/dL (calc) — ABNORMAL HIGH
Non-HDL Cholesterol (Calc): 178 mg/dL (calc) — ABNORMAL HIGH (ref <130)
Total CHOL/HDL Ratio: 6.7 (calc) — ABNORMAL HIGH (ref <5.0)
Triglycerides: 200 mg/dL — ABNORMAL HIGH (ref <150)

## 2020-03-11 NOTE — Progress Notes (Signed)
Tried to contact patient, left voicemail.

## 2020-03-12 ENCOUNTER — Telehealth: Payer: Self-pay

## 2020-03-12 NOTE — Telephone Encounter (Signed)
Copied from CRM [id number]. Topic: General - Other >> Mar 12, 2020  8:18 AM [name], [name] wrote: Reason for CRM: PT was returning the nurses call regarding his lab results. Please advise.

## 2020-06-06 NOTE — Progress Notes (Signed)
Name: Isaac Delacruz   MRN: [id number]    DOB: 19-Mar-1965   Date:06/07/2020       Progress Note  Subjective  Chief Complaint  Follow Up  HPI  MorbidObesity: he states doing a little better, he is still drinking sodas , he was unable to change to diet, but has lost 7 lbs since last visit, states likely from working 12 hour days 6 days a week.   HTN:he is now taking bp medication daily bp today is at goal. He denies  chest pain, dizziness  or palpitation . He denies side effects of medication   Dyslipidemia: Last LDL was 144 , HDL still low at 31, we switched from Atorvastatin to Rosuvastatin 40 mg Feb 2021 and is also taking Zetia now, no side effects of medication. Discussed adding Vascepa to get cholesterol to goal. Due to increase risk of heart attacks and strokes. He states he is compliant with medications. 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   The 10-year ASCVD risk score Denman George DC Montez Hageman., et al., 2013) is: 20.9%   Values used to calculate the score:     Age: 55 years     Sex: Male     Is Non-Hispanic African American: Yes     Diabetic: No     Tobacco smoker: Yes     Systolic Blood Pressure: 134 mmHg     Is BP treated: Yes     HDL Cholesterol: 31 mg/dL     Total Cholesterol: 209 mg/dL  Pre-diabetes: he denies polyphagia, polydipsia or polyuria. He is back on Metformin since July 2020. A1C Feb 2022  was 6 %. Lost weight since last visit   [place]: [place], he states skin itches a lot a night and doing better with loratadine and hydroxizine, he states symptoms are stable. He is only on topical medication now and doing well.   Cervical lymphadenopathy: with abnormal [place] and had CT neck, referred to Dr. Lady Gary, she did not recommend a biopsy or removal, he saw Dr. Genevive Bi and biopsy was done 12/2018 and it was reactive lymphadenopathy, he states it is gone now   OSA: he wears every time he goes to bed, he keeps it on for  about 5 hours.He has been sleeping a little over 6 hours and is feeling rested   Patient Active Problem List   Diagnosis Date Noted  . Enlarged lymph node 09/22/2018  . Seasonal allergic rhinitis 08/18/2016  . Folliculitis 05/23/2015  . Allergic dermatitis 05/23/2015  . History of Bell's palsy 07/18/2014  . Benign essential HTN 07/18/2014  . Dyslipidemia 07/18/2014  . LBP (low back pain) 07/18/2014  . Vitamin D deficiency 07/18/2014  . Dysmetabolic syndrome 07/18/2014  . Microalbuminuria 07/18/2014  . Extreme obesity 07/18/2014  . Obstructive apnea 07/18/2014  . Tobacco use 07/18/2014    Past Surgical History:  Procedure Laterality Date  . COLONOSCOPY    . COLONOSCOPY WITH PROPOFOL N/A 11/11/2016   Procedure: COLONOSCOPY WITH PROPOFOL;  Surgeon: Toney Reil, MD;  Location[facility];  Service: Endoscopy;  Laterality: N/A;  sleep apnea    Family History  Problem Relation Age of Onset  . Cancer Mother 27       Lung  . Cancer Father        Prostate  . Diabetes Father   . Hypertension Brother   . Cancer Maternal Aunt        Lung  . Heart disease Brother  Social History   Tobacco Use  . Smoking status: Current Some Day Smoker    Years: 22.00    Types: Cigars    Start date: 10/12/1997  . Smokeless tobacco: Never Used  . Tobacco comment: Smokes 2 cigars daily  Substance Use Topics  . Alcohol use: Yes    Alcohol/week: 0.0 standard drinks    Comment: rarely     Current Outpatient Medications:  .  aspirin 81 MG tablet, Take by mouth., Disp: , Rfl:  .  ezetimibe (ZETIA) 10 MG tablet, Take 1 tablet (10 mg total) by mouth daily. With Rosuvastatin for cholesterol, Disp: 90 tablet, Rfl: 3 .  fluticasone (FLONASE) 50 MCG/ACT nasal spray, Place 2 sprays into both nostrils daily., Disp: 16 g, Rfl: 2 .  hydrOXYzine (ATARAX/VISTARIL) 25 MG tablet, Take 1 tablet (25 mg total) by mouth 2 (two) times daily as needed., Disp: 180 tablet, Rfl: 1 .  lisinopril  (ZESTRIL) 10 MG tablet, Take 1 tablet (10 mg total) by mouth daily., Disp: 90 tablet, Rfl: 1 .  loratadine (CLARITIN) 10 MG tablet, Take 1 tablet (10 mg total) by mouth daily., Disp: 90 tablet, Rfl: 1 .  metFORMIN (GLUCOPHAGE) 500 MG tablet, Take 1 tablet (500 mg total) by mouth 2 (two) times daily with a meal., Disp: 180 tablet, Rfl: 1 .  rosuvastatin (CRESTOR) 40 MG tablet, Take 1 tablet (40 mg total) by mouth daily. In place of Atorvastatin, Disp: 90 tablet, Rfl: 1 .  VASCEPA 1 g capsule, Take 2 capsules (2 g total) by mouth 2 (two) times daily., Disp: 360 capsule, Rfl: 1 .  Vitamin D, Ergocalciferol, (DRISDOL) 1.25 MG (50000 UNIT) CAPS capsule, Take 1 capsule (50,000 Units total) by mouth every 7 (seven) days., Disp: 12 capsule, Rfl: 1  No Known Allergies  I personally reviewed active problem list, medication list, allergies, family history, social history, health maintenance with the patient/caregiver today.   ROS  Constitutional: Negative for fever , positive  weight change.  Respiratory: Negative for cough and shortness of breath.   Cardiovascular: Negative for chest pain or palpitations.  Gastrointestinal: Negative for abdominal pain, no bowel changes.  Musculoskeletal: Negative for gait problem or joint swelling.  Skin: Negative for rash.  Neurological: Negative for dizziness or headache.  No other specific complaints in a complete review of systems (except as listed in HPI above).  Objective  Vitals:   06/07/20 0752  BP: 134/74  Pulse: 81  Resp: 16  Temp: 98 F (36.7 C)  TempSrc: Oral  SpO2: 97%  Weight: 249 lb (112.9 kg)  Height: 5\' 4"  (1.626 m)    Body mass index is 42.74 kg/m.  Physical Exam  Constitutional: Patient appears well-developed and well-nourished. Obese  No distress.  HEENT: head atraumatic, normocephalic, pupils equal and reactive to light,  neck supple Cardiovascular: Normal rate, regular rhythm and normal heart sounds.  No murmur heard. No BLE  edema. Pulmonary/Chest: Effort normal and breath sounds normal. No respiratory distress. Abdominal: Soft.  There is no tenderness. Skin: doing well, no active folliculitis , still has scars from scratching, old Psychiatric: Patient has a normal mood and affect. behavior is normal. Judgment and thought content normal.  PHQ2/9: Depression screen [facility] 2/9 06/07/2020 03/08/2020 09/06/2019 03/27/2019 11/23/2018  Decreased Interest 0 0 0 0 0  Down, Depressed, Hopeless 0 0 0 0 0  PHQ - 2 Score 0 0 0 0 0  Altered sleeping - - 0 0 0  Tired, decreased energy - - 0  0 0  Change in appetite - - 0 0 0  Feeling bad or failure about yourself  - - 0 0 0  Trouble concentrating - - 0 0 0  Moving slowly or fidgety/restless - - 0 0 0  Suicidal thoughts - - 0 0 0  PHQ-9 Score - - 0 0 0  Difficult doing work/chores - - Not difficult at all Not difficult at all -    phq 9 is negative   Fall Risk: Fall Risk  06/07/2020 03/08/2020 09/06/2019 03/27/2019 11/23/2018  Falls in the past year? 0 0 0 0 0  Number falls in past yr: 0 0 0 0 0  Injury with Fall? 0 0 0 0 0  Follow up - - Falls evaluation completed - -     Assessment & Plan  1. Morbid obesity, unspecified obesity type [facility])  Discussed with the patient the risk posed by an increased BMI. Discussed importance of portion control, calorie counting and at least 150 minutes of physical activity weekly. Avoid sweet beverages and drink more water. Eat at least 6 servings of fruit and vegetables daily   2. Shift work sleep disorder  Works from 7 pm to 7 am   3. Benign essential HTN  At goal, continue medication   4. Dysmetabolic syndrome  He is eating healthier at home, needs to back down on sodas  5. Allergic dermatitis   6. Dyslipidemia  - VASCEPA 1 g capsule; Take 2 capsules (2 g total) by mouth 2 (two) times daily.  Dispense: 360 capsule; Refill: 1  7. Vitamin D deficiency  Continue supplementation   8. Obstructive apnea  Compliant   9.  Folliculitis  Doing well

## 2020-06-07 ENCOUNTER — Other Ambulatory Visit: Payer: Self-pay

## 2020-06-07 ENCOUNTER — Encounter: Payer: Self-pay | Admitting: Family Medicine

## 2020-06-07 ENCOUNTER — Ambulatory Visit: Payer: No Typology Code available for payment source | Admitting: Family Medicine

## 2020-06-07 DIAGNOSIS — L739 Follicular disorder, unspecified: Secondary | ICD-10-CM

## 2020-06-07 DIAGNOSIS — G4726 Circadian rhythm sleep disorder, shift work type: Secondary | ICD-10-CM

## 2020-06-07 DIAGNOSIS — I10 Essential (primary) hypertension: Secondary | ICD-10-CM | POA: Diagnosis not present

## 2020-06-07 DIAGNOSIS — E559 Vitamin D deficiency, unspecified: Secondary | ICD-10-CM

## 2020-06-07 DIAGNOSIS — G4733 Obstructive sleep apnea (adult) (pediatric): Secondary | ICD-10-CM

## 2020-06-07 DIAGNOSIS — E8881 Metabolic syndrome: Secondary | ICD-10-CM

## 2020-06-07 DIAGNOSIS — L239 Allergic contact dermatitis, unspecified cause: Secondary | ICD-10-CM

## 2020-06-07 DIAGNOSIS — E785 Hyperlipidemia, unspecified: Secondary | ICD-10-CM

## 2020-06-07 MED ORDER — VASCEPA 1 G PO CAPS: 2.0000 g | ORAL_CAPSULE | Freq: Two times a day (BID) | ORAL | 1 refills | Status: DC

## 2020-06-07 NOTE — Patient Instructions (Signed)
Hamstring Strain Rehab Ask your health care provider which exercises are safe for you. Do exercises exactly as told by your health care provider and adjust them as directed. It is normal to feel mild stretching, pulling, tightness, or discomfort as you do these exercises. Stop right away if you feel sudden pain or your pain gets worse. Do not begin these exercises until told by your health care provider. Stretching and range-of-motion exercises These exercises warm up your muscles and joints and improve the movement and flexibility of your thighs. These exercises also help to relieve pain, numbness, and tingling. Talk to your health care provider about these restrictions. Knee extension, seated 1. Sit with your left / right heel propped on a chair, a coffee table, or a footstool. Do not have anything under your knee to support it. 2. Allow your leg muscles to relax, letting gravity straighten out your knee (extension). You should feel a stretch behind your left / right knee. 3. If told by your health care provider, deepen the stretch by placing a __________ weight on your thigh, just above your kneecap. 4. Hold this position for __________ seconds. Repeat __________ times. Complete this exercise __________ times a day.   Seated stretch This exercise is sometimes called hamstrings and adductors stretch. 1. Sit on the floor with your legs stretched wide. Keep your knees straight during this exercise. 2. Keeping your head and back in a straight line, bend at your waist to reach for your left foot (position A). You should feel a stretch in your right inner thigh (adductors). 3. Hold this position for __________ seconds. Then slowly return to the upright position. 4. Keeping your head and back in a straight line, bend at your waist to reach forward (position B). You should feel a stretch behind both of your thighs or knees (hamstrings). 5. Hold this position for __________ seconds. Then slowly return to  the upright position. 6. Keeping your head and back in a straight line, bend at your waist to reach for your right foot (position C). You should feel a stretch in your left inner thigh (adductors). 7. Hold this position for __________ seconds. Then slowly return to the upright position. Repeat __________ times. Complete this exercise __________ times a day.   Hamstrings stretch, supine 1. Lie on your back (supine position). 2. Loop a belt or towel over the ball of your left / right foot. The ball of your foot is on the walking surface, right under your toes. 3. Straighten your left / right knee and slowly pull on the belt or towel to raise your leg. ? Do not let your left / right knee bend while you do this. ? Keep your other leg flat on the floor. ? Raise the left / right leg until you feel a gentle stretch behind your left / right knee or thigh (hamstrings). 4. Hold this position for __________ seconds. 5. Slowly return your leg to the starting position. Repeat __________ times. Complete this exercise __________ times a day.   Strengthening exercises These exercises build strength and endurance in your thighs. Endurance is the ability to use your muscles for a long time, even after they get tired. Straight leg raises, prone This exercise strengthens the muscles that move the hips (hip extensors). 1. Lie on your abdomen on a firm surface (prone position). 2. Tense the muscles in your buttocks and lift your left / right leg about 4 inches (10 cm). Keep your knee straight as you lift   your leg. If you cannot lift your leg that high without arching your back, place a pillow under your hips. 3. Hold the position for __________ seconds. 4. Slowly lower your leg to the starting position. 5. Allow your muscles to relax completely before you start the next repetition. Repeat __________ times. Complete this exercise __________ times a day.   Bridge This exercise strengthens the muscles in your  buttocks and the back of your thighs (hip extensors). 1. Lie on your back on a firm surface with your knees bent and your feet flat on the floor. 2. Tighten your buttocks muscles and lift your bottom off the floor until the trunk of your body is level with your thighs. ? You should feel the muscles working in your buttocks and the back of your thighs. ? Do not arch your back. 3. Hold this position for __________ seconds. 4. Slowly lower your hips to the starting position. 5. Let your buttocks muscles relax completely between repetitions. 6. If told by your health care provider, keep your bottom lifted off the floor while you slowly walk your feet away from you as far as you can control. Hold for __________ seconds, then slowly walk your feet back toward you. Repeat __________ times. Complete this exercise __________ times a day.   Lateral walking with band This is an exercise in which you walk sideways (lateral), with tension provided by an exercise band. The exercise strengthens the muscles in your hip (hip abductors). 1. Stand in a long hallway. 2. Wrap a loop of exercise band around your legs, just above your knees. 3. Bend your knees gently and drop your hips down and back so your weight is over your heels. 4. Step to the side to move down the length of the hallway, keeping your toes pointed ahead of you and keeping tension in the band. 5. Repeat, leading with your other leg. Repeat __________ times. Complete this exercise __________ times a day. Single leg stand with reaching This exercise is also called eccentric hamstring stretch. 1. Stand on your left / right foot. Keep your big toe down on the floor and try to keep your arch lifted. 2. Slowly reach down toward the floor as far as you can while keeping your balance. Lowering your thigh under tension is called eccentric stretching. 3. Hold this position for __________ seconds. Repeat __________ times. Complete this exercise __________  times a day. Plank, prone This exercise strengthens muscles in your abdomen and core area. 1. Lie on your abdomen on the floor (prone position),and prop yourself up on your elbows. Your hands should be straight out in front of you, and your elbows should be below your shoulders. Position your feet similar to a push-up position so your toes are on the ground. 2. Tighten your abdominal muscles and lift your body off the floor. ? Do not arch your back. ? Do not hold your breath. 3. Hold this position for __________ seconds. Repeat __________ times. Complete this exercise __________ times a day. This information is not intended to replace advice given to you by your health care provider. Make sure you discuss any questions you have with your health care provider. Document Revised: 05/12/2018 Document Reviewed: 01/17/2018 [employer]vier Patient Education  2021 [employer]  

## 2020-06-10 LAB — HM DIABETES EYE EXAM

## 2020-09-09 NOTE — Progress Notes (Signed)
Name: Isaac Delacruz   MRN: [id number]    DOB: 1965-10-29   Date:09/10/2020       Progress Note  Subjective  Chief Complaint  Annual Exam  HPI  Patient presents for annual CPE .  IPSS Questionnaire (AUA-7): Over the past month.   1)  How often have you had a sensation of not emptying your bladder completely after you finish urinating?  0 - Not at all  2)  How often have you had to urinate again less than two hours after you finished urinating? 0 - Not at all  3)  How often have you found you stopped and started again several times when you urinated?  0 - Not at all  4) How difficult have you found it to postpone urination?  0 - Not at all  5) How often have you had a weak urinary stream?  0 - Not at all  6) How often have you had to push or strain to begin urination?  0 - Not at all  7) How many times did you most typically get up to urinate from the time you went to bed until the time you got up in the morning?  1 - 1 time  Total score:  0-7 mildly symptomatic   8-19 moderately symptomatic   20-35 severely symptomatic     Diet: reviewed a low carb diet  Exercise: discussed 150 minutes of physical activity per week   Depression: phq 9 is negative Depression screen [facility] 2/9 09/10/2020 06/07/2020 03/08/2020 09/06/2019 03/27/2019  Decreased Interest 0 0 0 0 0  Down, Depressed, Hopeless 0 0 0 0 0  PHQ - 2 Score 0 0 0 0 0  Altered sleeping - - - 0 0  Tired, decreased energy - - - 0 0  Change in appetite - - - 0 0  Feeling bad or failure about yourself  - - - 0 0  Trouble concentrating - - - 0 0  Moving slowly or fidgety/restless - - - 0 0  Suicidal thoughts - - - 0 0  PHQ-9 Score - - - 0 0  Difficult doing work/chores - - - Not difficult at all Not difficult at all    Hypertension:  BP Readings from Last 3 Encounters:  09/10/20 124/70  06/07/20 134/74  03/08/20 136/88    Obesity: Wt Readings from Last 3 Encounters:  09/10/20 243 lb (110.2 kg)  06/07/20 249 lb (112.9 kg)  03/08/20  255 lb 14.4 oz (116.1 kg)   BMI Readings from Last 3 Encounters:  09/10/20 41.71 kg/m  06/07/20 42.74 kg/m  03/08/20 43.93 kg/m     Lipids:  Lab Results  Component Value Date   CHOL 209 (H) 03/08/2020   CHOL 223 (H) 09/06/2019   CHOL 188 11/23/2018   Lab Results  Component Value Date   HDL 31 (L) 03/08/2020   HDL 36 (L) 09/06/2019   HDL 32 (L) 11/23/2018   Lab Results  Component Value Date   LDLCALC 144 (H) 03/08/2020   LDLCALC 153 (H) 09/06/2019   LDLCALC 131 (H) 11/23/2018   Lab Results  Component Value Date   TRIG 200 (H) 03/08/2020   TRIG 203 (H) 09/06/2019   TRIG 142 11/23/2018   Lab Results  Component Value Date   CHOLHDL 6.7 (H) 03/08/2020   CHOLHDL 6.2 (H) 09/06/2019   CHOLHDL 5.9 (H) 11/23/2018   No results found for: LDLDIRECT Glucose:  Glucose, Bld  Date Value Ref Range Status  03/08/2020 96 65 - 99 mg/dL Final    Comment:    .            Fasting reference interval .   09/06/2019 91 65 - 99 mg/dL Final    Comment:    .            Fasting reference interval .   11/23/2018 101 (H) 65 - 99 mg/dL Final    Comment:    .            Fasting reference interval . For someone without known diabetes, a glucose value between 100 and 125 mg/dL is consistent with prediabetes and should be confirmed with a follow-up test. .    Glucose-Capillary  Date Value Ref Range Status  12/07/2018 83 70 - 99 mg/dL Final  16/11/9602 94 65 - 99 mg/dL Final    Flowsheet Row Office Visit from 09/10/2020 in [facility]  AUDIT-C Score 1      Married  STD testing and prevention (HIV/chl/gon/syphilis): 09/09/16 Hep C: 09/09/16  Skin cancer: Discussed monitoring for atypical lesions Colorectal cancer: 11/11/16  Prostate cancer:  Lab Results  Component Value Date   PSA 0.6 09/06/2019   PSA 0.6 08/23/2018   PSA 0.8 09/09/2016     Lung cancer:  Low Dose CT Chest recommended if Age 22-80 years, 30 pack-year currently smoking OR have  quit w/in 15years. Patient does not qualify.    ECG:  11/22/15  Vaccines:    Shingrix: 3-64 yo and ask insurance if covered when patient above 42 yo Pneumonia: educated and discussed with patient. Flu: educated and discussed with patient. Next visit   Advanced Care Planning: A voluntary discussion about advance care planning including the explanation and discussion of advance directives.  Discussed health care proxy and Living will, and the patient was able to identify a health care proxy as significant other - Jannett Celestine   Patient Active Problem List   Diagnosis Date Noted   Enlarged lymph node 09/22/2018   Seasonal allergic rhinitis 08/18/2016   Folliculitis 05/23/2015   Allergic dermatitis 05/23/2015   History of Bell's palsy 07/18/2014   Benign essential HTN 07/18/2014   Dyslipidemia 07/18/2014   LBP (low back pain) 07/18/2014   Vitamin D deficiency 07/18/2014   Dysmetabolic syndrome 07/18/2014   Microalbuminuria 07/18/2014   Extreme obesity 07/18/2014   Obstructive apnea 07/18/2014   Tobacco use 07/18/2014    Past Surgical History:  Procedure Laterality Date   COLONOSCOPY     COLONOSCOPY WITH PROPOFOL N/A 11/11/2016   Procedure: COLONOSCOPY WITH PROPOFOL;  Surgeon: Toney Reil, MD;  Location: [facility];  Service: Endoscopy;  Laterality: N/A;  sleep apnea    Family History  Problem Relation Age of Onset   Cancer Mother 71       Lung   Cancer Father        Prostate   Diabetes Father    Hypertension Brother    Cancer Maternal Aunt        Lung   Heart disease Brother     Social History   Socioeconomic History   Marital status: Married    Spouse name: Dorris    Number of children: 7   Years of education: Not on file   Highest education level: 11th grade  Occupational History   Occupation: Scientist, physiological at the dye [employer]   Tobacco Use   Smoking status: Some Days    Types: Cigars  Start date: 10/12/1997   Smokeless tobacco: Never    Tobacco comments:    Smokes 2 cigars daily  Vaping Use   Vaping Use: Never used  Substance and Sexual Activity   Alcohol use: Yes    Alcohol/week: 0.0 standard drinks    Comment: rarely   Drug use: No   Sexual activity: Yes    Partners: Female  Other Topics Concern   Not on file  Social History Narrative   Works full time at [employer] in the dye room    Lives with wife  and they have 3 children together, just one child at home    Social Determinants of Health   Financial Resource Strain: Low Risk    Difficulty of Paying Living Expenses: Not hard at all  Food Insecurity: No Food Insecurity   Worried About Programme researcher, broadcasting/film/video in the Last Year: Never true   Barista in the Last Year: Never true  Transportation Needs: No Transportation Needs   Lack of Transportation (Medical): No   Lack of Transportation (Non-Medical): No  Physical Activity: Insufficiently Active   Days of Exercise per Week: 2 days   Minutes of Exercise per Session: 30 min  Stress: No Stress Concern Present   Feeling of Stress : Not at all  Social Connections: Moderately Isolated   Frequency of Communication with Friends and Family: Once a week   Frequency of Social Gatherings with Friends and Family: More than three times a week   Attends Religious Services: More than 4 times per year   Active Member of [employer] or Organizations: No   Attends Banker Meetings: Never   Marital Status: Separated  Intimate Partner Violence: Not At Risk   Fear of Current or Ex-Partner: No   Emotionally Abused: No   Physically Abused: No   Sexually Abused: No     Current Outpatient Medications:    aspirin 81 MG tablet, Take by mouth., Disp: , Rfl:    ezetimibe (ZETIA) 10 MG tablet, Take 1 tablet (10 mg total) by mouth daily. With Rosuvastatin for cholesterol, Disp: 90 tablet, Rfl: 3   fluticasone (FLONASE) 50 MCG/ACT nasal spray, Place 2 sprays into both nostrils daily., Disp: 16 g, Rfl: 2   hydrOXYzine  (ATARAX/VISTARIL) 25 MG tablet, Take 1 tablet (25 mg total) by mouth 2 (two) times daily as needed., Disp: 180 tablet, Rfl: 1   lisinopril (ZESTRIL) 10 MG tablet, Take 1 tablet (10 mg total) by mouth daily., Disp: 90 tablet, Rfl: 1   loratadine (CLARITIN) 10 MG tablet, Take 1 tablet (10 mg total) by mouth daily., Disp: 90 tablet, Rfl: 1   metFORMIN (GLUCOPHAGE) 500 MG tablet, Take 1 tablet (500 mg total) by mouth 2 (two) times daily with a meal., Disp: 180 tablet, Rfl: 1   rosuvastatin (CRESTOR) 40 MG tablet, Take 1 tablet (40 mg total) by mouth daily. In place of Atorvastatin, Disp: 90 tablet, Rfl: 1   VASCEPA 1 g capsule, Take 2 capsules (2 g total) by mouth 2 (two) times daily., Disp: 360 capsule, Rfl: 1   Vitamin D, Ergocalciferol, (DRISDOL) 1.25 MG (50000 UNIT) CAPS capsule, Take 1 capsule (50,000 Units total) by mouth every 7 (seven) days., Disp: 12 capsule, Rfl: 1  No Known Allergies   ROS  Constitutional: Negative for fever , positive for weight change.  Respiratory: Negative for cough and shortness of breath.   Cardiovascular: Negative for chest pain or palpitations.  Gastrointestinal: Negative for abdominal  pain, no bowel changes.  Musculoskeletal: Negative for gait problem or joint swelling.  Skin: positive for chronic rash.  Neurological: Negative for dizziness or headache.  No other specific complaints in a complete review of systems (except as listed in HPI above).    Objective  Vitals:   09/10/20 0825  BP: 124/70  Pulse: 84  Resp: 16  Temp: 97.7 F (36.5 C)  SpO2: 97%  Weight: 243 lb (110.2 kg)  Height: 5\' 4"  (1.626 m)    Body mass index is 41.71 kg/m.  Physical Exam  Constitutional: Patient appears well-developed and well-nourished. No distress.  HENT: Head: Normocephalic and atraumatic. Ears: B TMs ok, no erythema or effusion; Nose: Nose normal. Mouth/Throat: not done  Eyes: Conjunctivae and EOM are normal. Pupils are equal, round, and reactive to light.  No scleral icterus.  Neck: Normal range of motion. Neck supple. No JVD present. No thyromegaly present.  Cardiovascular: Normal rate, regular rhythm and normal heart sounds.  No murmur heard. No BLE edema. Pulmonary/Chest: Effort normal and breath sounds normal. No respiratory distress. Abdominal: Soft. Bowel sounds are normal, no distension. There is no tenderness. no masses MALE GENITALIA: Normal descended testes bilaterally, no masses palpated, no hernias, no lesions, no discharge RECTAL: Prostate normal size and consistency, no rectal masses or hemorrhoids Musculoskeletal: Normal range of motion, no joint effusions. No gross deformities Neurological: he is alert and oriented to person, place, and time. No cranial nerve deficit. Coordination, balance, strength, speech and gait are normal.  Skin: Skin is warm and dry. Dry and bumpy  rashes, with some hyperpigmentation  Psychiatric: Patient has a normal mood and affect. behavior is normal. Judgment and thought content normal.   Fall Risk: Fall Risk  09/10/2020 06/07/2020 03/08/2020 09/06/2019 03/27/2019  Falls in the past year? 0 0 0 0 0  Number falls in past yr: 0 0 0 0 0  Injury with Fall? 0 0 0 0 0  Risk for fall due to : No Fall Risks - - - -  Follow up Falls prevention discussed - - Falls evaluation completed -      Functional Status Survey: Is the patient deaf or have difficulty hearing?: No Does the patient have difficulty seeing, even when wearing glasses/contacts?: No Does the patient have difficulty concentrating, remembering, or making decisions?: No Does the patient have difficulty walking or climbing stairs?: No Does the patient have difficulty dressing or bathing?: No Does the patient have difficulty doing errands alone such as visiting a doctor's office or shopping?: No    Assessment & Plan   1. Well adult exam  - CBC with Differential/Platelet - COMPLETE METABOLIC PANEL WITH GFR  2. Dyslipidemia  - Lipid panel  3.  Dysmetabolic syndrome  - Hemoglobin A1c  4. Vitamin D deficiency  - VITAMIN D 25 Hydroxy (Vit-D Deficiency, Fractures)  5. Microalbuminuria  - Microalbumin / creatinine urine ratio - COMPLETE METABOLIC PANEL WITH GFR  6. Prostate cancer screening  - PSA  7. Need for shingles vaccine  - Varicella-zoster vaccine IM    -Prostate cancer screening and PSA options (with potential risks and benefits of testing vs not testing) were discussed along with recent recs/guidelines. -USPSTF grade A and B recommendations reviewed with patient; age-appropriate recommendations, preventive care, screening tests, etc discussed and encouraged; healthy living encouraged; see AVS for patient education given to patient -Discussed importance of 150 minutes of physical activity weekly, eat two servings of fish weekly, eat one serving of tree nuts ( cashews,  pistachios, pecans, almonds.Marland Kitchen.) every other day, eat 6 servings of fruit/vegetables daily and drink plenty of water and avoid sweet beverages.

## 2020-09-09 NOTE — Patient Instructions (Signed)
Preventive Care 40-53 Years Old, Male Preventive care refers to lifestyle choices and visits with your health care provider that can promote health and wellness. This includes: A yearly physical exam. This is also called an annual wellness visit. Regular dental and eye exams. Immunizations. Screening for certain conditions. Healthy lifestyle choices, such as: Eating a healthy diet. Getting regular exercise. Not using drugs or products that contain nicotine and tobacco. Limiting alcohol use. What can I expect for my preventive care visit? Physical exam Your health care provider will check your: Height and weight. These may be used to calculate your BMI (body mass index). BMI is a measurement that tells if you are at a healthy weight. Heart rate and blood pressure. Body temperature. Skin for abnormal spots. Counseling Your health care provider may ask you questions about your: Past medical problems. Family's medical history. Alcohol, tobacco, and drug use. Emotional well-being. Home life and relationship well-being. Sexual activity. Diet, exercise, and sleep habits. Work and work environment. Access to firearms. What immunizations do I need?  Vaccines are usually given at various ages, according to a schedule. Your health care provider will recommend vaccines for you based on your age, medicalhistory, and lifestyle or other factors, such as travel or where you work. What tests do I need? Blood tests Lipid and cholesterol levels. These may be checked every 5 years, or more often if you are over 50 years old. Hepatitis C test. Hepatitis B test. Screening Lung cancer screening. You may have this screening every year starting at age 55 if you have a 30-pack-year history of smoking and currently smoke or have quit within the past 15 years. Prostate cancer screening. Recommendations will vary depending on your family history and other risks. Genital exam to check for testicular cancer  or hernias. Colorectal cancer screening. All adults should have this screening starting at age 50 and continuing until age 75. Your health care provider may recommend screening at age 45 if you are at increased risk. You will have tests every 1-10 years, depending on your results and the type of screening test. Diabetes screening. This is done by checking your blood sugar (glucose) after you have not eaten for a while (fasting). You may have this done every 1-3 years. STD (sexually transmitted disease) testing, if you are at risk. Follow these instructions at home: Eating and drinking  Eat a diet that includes fresh fruits and vegetables, whole grains, lean protein, and low-fat dairy products. Take vitamin and mineral supplements as recommended by your health care provider. Do not drink alcohol if your health care provider tells you not to drink. If you drink alcohol: Limit how much you have to 0-2 drinks a day. Be aware of how much alcohol is in your drink. In the [place] one drink equals one 12 oz bottle of beer (355 mL), one 5 oz glass of wine (148 mL), or one 1 oz glass of hard liquor (44 mL).  Lifestyle Take daily care of your teeth and gums. Brush your teeth every morning and night with fluoride toothpaste. Floss one time each day. Stay active. Exercise for at least 30 minutes 5 or more days each week. Do not use any products that contain nicotine or tobacco, such as cigarettes, e-cigarettes, and chewing tobacco. If you need help quitting, ask your health care provider. Do not use drugs. If you are sexually active, practice safe sex. Use a condom or other form of protection to prevent STIs (sexually transmitted infections). If told by   your health care provider, take low-dose aspirin daily starting at age 50. Find healthy ways to cope with stress, such as: Meditation, yoga, or listening to music. Journaling. Talking to a trusted person. Spending time with friends and  family. Safety Always wear your seat belt while driving or riding in a vehicle. Do not drive: If you have been drinking alcohol. Do not ride with someone who has been drinking. When you are tired or distracted. While texting. Wear a helmet and other protective equipment during sports activities. If you have firearms in your house, make sure you follow all gun safety procedures. What's next? Go to your health care provider once a year for an annual wellness visit. Ask your health care provider how often you should have your eyes and teeth checked. Stay up to date on all vaccines. This information is not intended to replace advice given to you by your health care provider. Make sure you discuss any questions you have with your healthcare provider. Document Revised: 10/18/2018 Document Reviewed: 01/13/2018 Elsevier Patient Education  2022 Elsevier Inc.  

## 2020-09-10 ENCOUNTER — Encounter: Payer: Self-pay | Admitting: Family Medicine

## 2020-09-10 ENCOUNTER — Other Ambulatory Visit: Payer: Self-pay

## 2020-09-10 ENCOUNTER — Ambulatory Visit (INDEPENDENT_AMBULATORY_CARE_PROVIDER_SITE_OTHER): Payer: 59 | Admitting: Family Medicine

## 2020-09-10 VITALS — BP 124/70 | HR 84 | Temp 97.7°F | Resp 16 | Ht 64.0 in | Wt 243.0 lb

## 2020-09-10 DIAGNOSIS — E8881 Metabolic syndrome: Secondary | ICD-10-CM

## 2020-09-10 DIAGNOSIS — Z23 Encounter for immunization: Secondary | ICD-10-CM

## 2020-09-10 DIAGNOSIS — E559 Vitamin D deficiency, unspecified: Secondary | ICD-10-CM

## 2020-09-10 DIAGNOSIS — E785 Hyperlipidemia, unspecified: Secondary | ICD-10-CM

## 2020-09-10 DIAGNOSIS — Z125 Encounter for screening for malignant neoplasm of prostate: Secondary | ICD-10-CM

## 2020-09-10 DIAGNOSIS — R809 Proteinuria, unspecified: Secondary | ICD-10-CM

## 2020-09-10 DIAGNOSIS — Z0000 Encounter for general adult medical examination without abnormal findings: Secondary | ICD-10-CM

## 2020-09-11 LAB — COMPLETE METABOLIC PANEL WITH GFR
AG Ratio: 1.4 (calc) (ref 1.0–2.5)
ALT: 9 U/L (ref 9–46)
AST: 16 U/L (ref 10–35)
Albumin: 4.4 g/dL (ref 3.6–5.1)
Alkaline phosphatase (APISO): 62 U/L (ref 35–144)
BUN: 17 mg/dL (ref 7–25)
CO2: 29 mmol/L (ref 20–32)
Calcium: 9.6 mg/dL (ref 8.6–10.3)
Chloride: 105 mmol/L (ref 98–110)
Creat: 1.06 mg/dL (ref 0.70–1.30)
Globulin: 3.2 g/dL (calc) (ref 1.9–3.7)
Glucose, Bld: 88 mg/dL (ref 65–99)
Potassium: 4.8 mmol/L (ref 3.5–5.3)
Sodium: 141 mmol/L (ref 135–146)
Total Bilirubin: 0.4 mg/dL (ref 0.2–1.2)
Total Protein: 7.6 g/dL (ref 6.1–8.1)
eGFR: 84 mL/min/{1.73_m2} (ref >=60)

## 2020-09-11 LAB — LIPID PANEL
Cholesterol: 210 mg/dL — ABNORMAL HIGH (ref <200)
HDL: 38 mg/dL — ABNORMAL LOW (ref >=40)
LDL Cholesterol (Calc): 148 mg/dL (calc) — ABNORMAL HIGH
Non-HDL Cholesterol (Calc): 172 mg/dL (calc) — ABNORMAL HIGH (ref <130)
Total CHOL/HDL Ratio: 5.5 (calc) — ABNORMAL HIGH (ref <5.0)
Triglycerides: 122 mg/dL (ref <150)

## 2020-09-11 LAB — CBC WITH DIFFERENTIAL/PLATELET
Absolute Monocytes: 699 cells/uL (ref 200–950)
Basophils Absolute: 50 cells/uL (ref 0–200)
Basophils Relative: 0.8 %
Eosinophils Absolute: 227 cells/uL (ref 15–500)
Eosinophils Relative: 3.6 %
HCT: 48.2 % (ref 38.5–50.0)
Hemoglobin: 15.6 g/dL (ref 13.2–17.1)
Lymphs Abs: 2085 cells/uL (ref 850–3900)
MCH: 28.5 pg (ref 27.0–33.0)
MCHC: 32.4 g/dL (ref 32.0–36.0)
MCV: 88 fL (ref 80.0–100.0)
MPV: 11.5 fL (ref 7.5–12.5)
Monocytes Relative: 11.1 %
Neutro Abs: 3238 cells/uL (ref 1500–7800)
Neutrophils Relative %: 51.4 %
Platelets: 189 10*3/uL (ref 140–400)
RBC: 5.48 10*6/uL (ref 4.20–5.80)
RDW: 14.2 % (ref 11.0–15.0)
Total Lymphocyte: 33.1 %
WBC: 6.3 10*3/uL (ref 3.8–10.8)

## 2020-09-11 LAB — HEMOGLOBIN A1C
Hgb A1c MFr Bld: 5.9 % of total Hgb — ABNORMAL HIGH (ref <5.7)
Mean Plasma Glucose: 123 mg/dL
eAG (mmol/L): 6.8 mmol/L

## 2020-09-11 LAB — VITAMIN D 25 HYDROXY (VIT D DEFICIENCY, FRACTURES): Vit D, 25-Hydroxy: 20 ng/mL — ABNORMAL LOW (ref 30–100)

## 2020-09-11 LAB — MICROALBUMIN / CREATININE URINE RATIO
Creatinine, Urine: 212 mg/dL (ref 20–320)
Microalb Creat Ratio: 2 mcg/mg creat (ref <30)
Microalb, Ur: 0.5 mg/dL

## 2020-09-11 LAB — PSA: PSA: 0.8 ng/mL (ref <=4.00)

## 2020-09-13 ENCOUNTER — Other Ambulatory Visit: Payer: Self-pay | Admitting: Family Medicine

## 2020-09-13 DIAGNOSIS — E785 Hyperlipidemia, unspecified: Secondary | ICD-10-CM

## 2020-09-13 MED ORDER — REPATHA 140 MG/ML ~~LOC~~ SOSY: 1.0000 mL | PREFILLED_SYRINGE | SUBCUTANEOUS | 12 refills | Status: DC

## 2020-09-18 ENCOUNTER — Other Ambulatory Visit: Payer: Self-pay | Admitting: Family Medicine

## 2020-09-18 DIAGNOSIS — J302 Other seasonal allergic rhinitis: Secondary | ICD-10-CM

## 2020-09-18 DIAGNOSIS — E785 Hyperlipidemia, unspecified: Secondary | ICD-10-CM

## 2020-09-19 ENCOUNTER — Telehealth: Payer: Self-pay

## 2020-09-19 NOTE — Telephone Encounter (Signed)
Copied from CRM [id number]. Topic: General - Other >> Sep 19, 2020  1:08 PM Marylen Ponto wrote: Reason for CRM: Crystal with [employer] called stated the PA was denied but it will be submitted again. Crystal provided access key: [id number]. Cb# [phone]

## 2020-09-23 NOTE — Telephone Encounter (Signed)
Re-sent through cover my meds

## 2020-11-11 ENCOUNTER — Encounter: Payer: Self-pay | Admitting: General Surgery

## 2020-12-09 ENCOUNTER — Ambulatory Visit: Payer: 59 | Admitting: Family Medicine

## 2020-12-12 NOTE — Progress Notes (Signed)
Name: Isaac Delacruz   MRN: [id number]    DOB: 06-16-1965   Date:12/13/2020       Progress Note  Subjective  Chief Complaint  Follow Up  HPI  Morbid Obesity: he states doing a little better, he is still drinking sodas , he was unable to change to diet, he gained 13 lbs since last visit, he had lost 7 lbs before that time. Explained importance of weight loss    HTN: he is now taking bp medication daily bp today is towards low end of normal, he denies orthostatic changes.  He denies  chest pain  or palpitation . He denies side effects of medication    Dyslipidemia: Last LDL was 144 , HDL still low at 31, we switched from Atorvastatin to Rosuvastatin 40 mg Feb 2021 and is also taking Zetia but last LDL still high at 148 and good cholesterol still low at 38  He was given Vascepa in May but is not taking it, also given a prescription for Repatha but states did not get it from pharmacy . We will give him a copy of his medication list to take to pharmacy today   The 10-year ASCVD risk score (Arnett DK, et al., 2019) is: 13.6%   Values used to calculate the score:     Age: 55 years     Sex: Male     Is Non-Hispanic African American: Yes     Diabetic: No     Tobacco smoker: Yes     Systolic Blood Pressure: 108 mmHg     Is BP treated: Yes     HDL Cholesterol: 38 mg/dL     Total Cholesterol: 210 mg/dL   Pre-diabetes: he denies polyphagia, polydipsia or polyuria. He is back on Metformin since July 2020. A1C Feb 2022  was 6 %.  Last A1C was down to 5.9 % in August    Dermatitis: He goes to Dermatologist in  [place], not taking antibiotics, he states skin itches a lot a night and doing better with loratadine and hydroxizine , he states symptoms are stable. He is only on topical medication now and doing well. Skin is dry and advised to increase moisturizer    OSA: he wears every time he goes to bed, he keeps it on for about 5-6 hours , continue compliance   Patient Active Problem List   Diagnosis  Date Noted   Enlarged lymph node 09/22/2018   Seasonal allergic rhinitis 08/18/2016   Folliculitis 05/23/2015   Allergic dermatitis 05/23/2015   History of Bell's palsy 07/18/2014   Benign essential HTN 07/18/2014   Dyslipidemia 07/18/2014   LBP (low back pain) 07/18/2014   Vitamin D deficiency 07/18/2014   Dysmetabolic syndrome 07/18/2014   Microalbuminuria 07/18/2014   Extreme obesity 07/18/2014   Obstructive apnea 07/18/2014   Tobacco use 07/18/2014    Past Surgical History:  Procedure Laterality Date   COLONOSCOPY     COLONOSCOPY WITH PROPOFOL N/A 11/11/2016   Procedure: COLONOSCOPY WITH PROPOFOL;  Surgeon: Toney Reil, MD;  Location: [facility];  Service: Endoscopy;  Laterality: N/A;  sleep apnea    Family History  Problem Relation Age of Onset   Cancer Mother 76       Lung   Cancer Father        Prostate   Diabetes Father    Hypertension Brother    Cancer Maternal Aunt        Lung   Heart disease Brother  Social History   Tobacco Use   Smoking status: Some Days    Types: Cigars    Start date: 10/12/1997   Smokeless tobacco: Never   Tobacco comments:    Smokes 2 cigars daily  Substance Use Topics   Alcohol use: Yes    Alcohol/week: 0.0 standard drinks    Comment: rarely     Current Outpatient Medications:    aspirin 81 MG tablet, Take by mouth., Disp: , Rfl:    Evolocumab (REPATHA) 140 MG/ML SOSY, Inject 1 mL into the skin every 14 (fourteen) days., Disp: 2.1 mL, Rfl: 12   ezetimibe (ZETIA) 10 MG tablet, TAKE ONE TABLET BY MOUTH ONCE DAILY WITH ROSUVASTATIN FOR CHOLESTEROL, Disp: 90 tablet, Rfl: 2   fluticasone (FLONASE) 50 MCG/ACT nasal spray, INHALE 2 SPRAYS IN EACH NOSTRIL ONCE A DAY, Disp: 16 g, Rfl: 2   hydrOXYzine (ATARAX/VISTARIL) 25 MG tablet, Take 1 tablet (25 mg total) by mouth 2 (two) times daily as needed., Disp: 180 tablet, Rfl: 1   loratadine (CLARITIN) 10 MG tablet, Take 1 tablet (10 mg total) by mouth daily., Disp: 90  tablet, Rfl: 1   lisinopril (ZESTRIL) 10 MG tablet, Take 1 tablet (10 mg total) by mouth daily., Disp: 90 tablet, Rfl: 1   metFORMIN (GLUCOPHAGE) 500 MG tablet, Take 1 tablet (500 mg total) by mouth 2 (two) times daily with a meal., Disp: 180 tablet, Rfl: 1   rosuvastatin (CRESTOR) 40 MG tablet, Take 1 tablet (40 mg total) by mouth daily. In place of Atorvastatin, Disp: 90 tablet, Rfl: 1   VASCEPA 1 g capsule, Take 2 capsules (2 g total) by mouth 2 (two) times daily., Disp: 360 capsule, Rfl: 1   Vitamin D, Ergocalciferol, (DRISDOL) 1.25 MG (50000 UNIT) CAPS capsule, Take 1 capsule (50,000 Units total) by mouth every 7 (seven) days., Disp: 12 capsule, Rfl: 1  No Known Allergies  I personally reviewed active problem list, medication list, allergies, family history, social history, health maintenance with the patient/caregiver today.   ROS  Constitutional: Negative for fever, positive for  weight change.  Respiratory: Negative for cough and shortness of breath.   Cardiovascular: Negative for chest pain or palpitations.  Gastrointestinal: Negative for abdominal pain, no bowel changes.  Musculoskeletal: Negative for gait problem or joint swelling.  Skin: Negative for rash.  Neurological: Negative for dizziness or headache.  No other specific complaints in a complete review of systems (except as listed in HPI above).   Objective  Vitals:   12/13/20 0810  BP: 108/76  Pulse: 90  Resp: 16  Temp: 97.8 F (36.6 C)  TempSrc: Oral  SpO2: 97%  Weight: 256 lb 1.6 oz (116.2 kg)  Height: 5\' 4"  (1.626 m)    Body mass index is 43.96 kg/m.  Physical Exam  Constitutional: Patient appears well-developed and well-nourished. Obese  No distress.  HEENT: head atraumatic, normocephalic, pupils equal and reactive to light, neck supple, Cardiovascular: Normal rate, regular rhythm and normal heart sounds.  No murmur heard. No BLE edema. Skin: some lesions on skin and dry  Pulmonary/Chest: Effort  normal and breath sounds normal. No respiratory distress. Abdominal: Soft.  There is no tenderness. Psychiatric: Patient has a normal mood and affect. behavior is normal. Judgment and thought content normal.   PHQ2/9: Depression screen [facility] 2/9 12/13/2020 09/10/2020 06/07/2020 03/08/2020 09/06/2019  Decreased Interest 0 0 0 0 0  Down, Depressed, Hopeless 0 0 0 0 0  PHQ - 2 Score 0 0 0 0 0  Altered sleeping 0 - - - 0  Tired, decreased energy 0 - - - 0  Change in appetite 0 - - - 0  Feeling bad or failure about yourself  0 - - - 0  Trouble concentrating 0 - - - 0  Moving slowly or fidgety/restless 0 - - - 0  Suicidal thoughts 0 - - - 0  PHQ-9 Score 0 - - - 0  Difficult doing work/chores Not difficult at all - - - Not difficult at all    phq 9 is negative   Fall Risk: Fall Risk  12/13/2020 09/10/2020 06/07/2020 03/08/2020 09/06/2019  Falls in the past year? 0 0 0 0 0  Number falls in past yr: 0 0 0 0 0  Injury with Fall? 0 0 0 0 0  Risk for fall due to : No Fall Risks No Fall Risks - - -  Follow up Falls prevention discussed Falls prevention discussed - - Falls evaluation completed      Functional Status Survey: Is the patient deaf or have difficulty hearing?: No Does the patient have difficulty seeing, even when wearing glasses/contacts?: No Does the patient have difficulty concentrating, remembering, or making decisions?: No Does the patient have difficulty walking or climbing stairs?: No Does the patient have difficulty dressing or bathing?: No Does the patient have difficulty doing errands alone such as visiting a doctor's office or shopping?: No    Assessment & Plan  1. Need for influenza vaccination  - Flu Vaccine QUAD 6+ mos PF IM (Fluarix Quad PF)  2. Dyslipidemia  - rosuvastatin (CRESTOR) 40 MG tablet; Take 1 tablet (40 mg total) by mouth daily. In place of Atorvastatin  Dispense: 90 tablet; Refill: 1 - VASCEPA 1 g capsule; Take 2 capsules (2 g total) by mouth 2 (two) times  daily.  Dispense: 360 capsule; Refill: 1  3. Dysmetabolic syndrome  - metFORMIN (GLUCOPHAGE) 500 MG tablet; Take 1 tablet (500 mg total) by mouth 2 (two) times daily with a meal.  Dispense: 180 tablet; Refill: 1  4. Benign essential HTN  - lisinopril (ZESTRIL) 10 MG tablet; Take 1 tablet (10 mg total) by mouth daily.  Dispense: 90 tablet; Refill: 1  5. Microalbuminuria  - lisinopril (ZESTRIL) 10 MG tablet; Take 1 tablet (10 mg total) by mouth daily.  Dispense: 90 tablet; Refill: 1  6. Vitamin D deficiency  - Vitamin D, Ergocalciferol, (DRISDOL) 1.25 MG (50000 UNIT) CAPS capsule; Take 1 capsule (50,000 Units total) by mouth every 7 (seven) days.  Dispense: 12 capsule; Refill: 1  7. Morbid obesity with BMI of 45.0-49.9, adult (HCC)   8. Obstructive apnea

## 2020-12-13 ENCOUNTER — Encounter: Payer: Self-pay | Admitting: Family Medicine

## 2020-12-13 ENCOUNTER — Ambulatory Visit (INDEPENDENT_AMBULATORY_CARE_PROVIDER_SITE_OTHER): Payer: 59 | Admitting: Family Medicine

## 2020-12-13 ENCOUNTER — Other Ambulatory Visit: Payer: Self-pay

## 2020-12-13 VITALS — BP 108/76 | HR 90 | Temp 97.8°F | Resp 16 | Ht 64.0 in | Wt 256.1 lb

## 2020-12-13 DIAGNOSIS — E785 Hyperlipidemia, unspecified: Secondary | ICD-10-CM

## 2020-12-13 DIAGNOSIS — E8881 Metabolic syndrome: Secondary | ICD-10-CM | POA: Diagnosis not present

## 2020-12-13 DIAGNOSIS — I10 Essential (primary) hypertension: Secondary | ICD-10-CM

## 2020-12-13 DIAGNOSIS — Z6841 Body Mass Index (BMI) 40.0 and over, adult: Secondary | ICD-10-CM

## 2020-12-13 DIAGNOSIS — G4733 Obstructive sleep apnea (adult) (pediatric): Secondary | ICD-10-CM

## 2020-12-13 DIAGNOSIS — E559 Vitamin D deficiency, unspecified: Secondary | ICD-10-CM

## 2020-12-13 DIAGNOSIS — Z23 Encounter for immunization: Secondary | ICD-10-CM | POA: Diagnosis not present

## 2020-12-13 DIAGNOSIS — R809 Proteinuria, unspecified: Secondary | ICD-10-CM

## 2020-12-13 MED ORDER — ROSUVASTATIN CALCIUM 40 MG PO TABS: 40.0000 mg | ORAL_TABLET | Freq: Every day | ORAL | 1 refills | Status: DC

## 2020-12-13 MED ORDER — VITAMIN D (ERGOCALCIFEROL) 1.25 MG (50000 UNIT) PO CAPS: 50000.0000 [IU] | ORAL_CAPSULE | ORAL | 1 refills | Status: DC

## 2020-12-13 MED ORDER — METFORMIN HCL 500 MG PO TABS: 500.0000 mg | ORAL_TABLET | Freq: Two times a day (BID) | ORAL | 1 refills | Status: DC

## 2020-12-13 MED ORDER — VASCEPA 1 G PO CAPS: 2.0000 g | ORAL_CAPSULE | Freq: Two times a day (BID) | ORAL | 1 refills | Status: DC

## 2020-12-13 MED ORDER — LISINOPRIL 10 MG PO TABS: 10.0000 mg | ORAL_TABLET | Freq: Every day | ORAL | 1 refills | Status: DC

## 2021-03-14 NOTE — Progress Notes (Signed)
Name: Isaac Delacruz   MRN: [id number]    DOB: Oct 18, 1965   Date:03/17/2021       Progress Note  Subjective  Chief Complaint  Follow Up  HPI  Morbid Obesity:BMI above 35 with co-morbidities such as HTN, dyslipidemia and OSA.  He gained 4 lbs since last visit. He has been trying to walk twice a week for one hour. He continues to eat fast food twice a week, he packs his lunch the other days. He is still having sodas and chips at work.    HTN:he is compliant with medication and bp is at goal  He denies  chest pain  or palpitation . He denies side effects of medication    Dyslipidemia: Last LDL was 144 , HDL still low at 31, we switched from Atorvastatin to Rosuvastatin 40 mg Feb 2021 and is also taking Zetia but last LDL still high at 148 and good cholesterol still low at 38  He was given [place] but states not covered by insurance  . We also gave him Repatha but too expensive. We will try adding Vascepa again today with a voucher   Pre-diabetes: he denies polyphagia, polydipsia or polyuria. He is back on Metformin since July 2020. A1C Feb 2022  was 6 %. , last A1C was down to 5.9 % 08/22    Dermatitis: He goes to Dermatologist in  [place], not taking antibiotics, he states skin itches a lot a night and doing better with loratadine and hydroxizine , he states symptoms are stable. He is only on topical medication now and doing well. Skin is dry and advised to increase moisturizer Unchanged    OSA: he wears every time he goes to bed, he keeps it on for about 5-6 hours ,continue good work   Patient Active Problem List   Diagnosis Date Noted   Enlarged lymph node 09/22/2018   Seasonal allergic rhinitis 08/18/2016   Folliculitis 05/23/2015   Allergic dermatitis 05/23/2015   History of Bell's palsy 07/18/2014   Benign essential HTN 07/18/2014   Dyslipidemia 07/18/2014   LBP (low back pain) 07/18/2014   Vitamin D deficiency 07/18/2014   Dysmetabolic syndrome 07/18/2014   Microalbuminuria  07/18/2014   Extreme obesity 07/18/2014   Obstructive apnea 07/18/2014   Tobacco use 07/18/2014    Past Surgical History:  Procedure Laterality Date   COLONOSCOPY     COLONOSCOPY WITH PROPOFOL N/A 11/11/2016   Procedure: COLONOSCOPY WITH PROPOFOL;  Surgeon: Toney Reil, MD;  Location: [facility];  Service: Endoscopy;  Laterality: N/A;  sleep apnea    Family History  Problem Relation Age of Onset   Cancer Mother 48       Lung   Cancer Father        Prostate   Diabetes Father    Hypertension Brother    Cancer Maternal Aunt        Lung   Heart disease Brother     Social History   Tobacco Use   Smoking status: Some Days    Types: Cigars    Start date: 10/12/1997   Smokeless tobacco: Never   Tobacco comments:    Smokes 2 cigars daily  Substance Use Topics   Alcohol use: Yes    Alcohol/week: 0.0 standard drinks    Comment: rarely     Current Outpatient Medications:    aspirin 81 MG tablet, Take by mouth., Disp: , Rfl:    ezetimibe (ZETIA) 10 MG tablet, TAKE ONE TABLET BY MOUTH  ONCE DAILY WITH ROSUVASTATIN FOR CHOLESTEROL, Disp: 90 tablet, Rfl: 2   hydrOXYzine (ATARAX/VISTARIL) 25 MG tablet, Take 1 tablet (25 mg total) by mouth 2 (two) times daily as needed., Disp: 180 tablet, Rfl: 1   lisinopril (ZESTRIL) 10 MG tablet, Take 1 tablet (10 mg total) by mouth daily., Disp: 90 tablet, Rfl: 1   loratadine (CLARITIN) 10 MG tablet, Take 1 tablet (10 mg total) by mouth daily., Disp: 90 tablet, Rfl: 1   metFORMIN (GLUCOPHAGE) 500 MG tablet, Take 1 tablet (500 mg total) by mouth 2 (two) times daily with a meal., Disp: 180 tablet, Rfl: 1   rosuvastatin (CRESTOR) 40 MG tablet, Take 1 tablet (40 mg total) by mouth daily. In place of Atorvastatin, Disp: 90 tablet, Rfl: 1   VASCEPA 1 g capsule, Take 2 capsules (2 g total) by mouth 2 (two) times daily., Disp: 360 capsule, Rfl: 1   Vitamin D, Ergocalciferol, (DRISDOL) 1.25 MG (50000 UNIT) CAPS capsule, Take 1 capsule  (50,000 Units total) by mouth every 7 (seven) days., Disp: 12 capsule, Rfl: 1   Evolocumab (REPATHA) 140 MG/ML SOSY, Inject 1 mL into the skin every 14 (fourteen) days. (Patient not taking: Reported on 03/17/2021), Disp: 2.1 mL, Rfl: 12   fluticasone (FLONASE) 50 MCG/ACT nasal spray, INHALE 2 SPRAYS IN EACH NOSTRIL ONCE A DAY (Patient not taking: Reported on 03/17/2021), Disp: 16 g, Rfl: 2  No Known Allergies  I personally reviewed active problem list, medication list, allergies, family history, social history, health maintenance with the patient/caregiver today.   ROS  Constitutional: Negative for fever or weight change.  Respiratory: Negative for cough and shortness of breath.   Cardiovascular: Negative for chest pain or palpitations.  Gastrointestinal: Negative for abdominal pain, no bowel changes.  Musculoskeletal: Negative for gait problem or joint swelling.  Skin: positive  for chronic  rash.  Neurological: Negative for dizziness or headache.  No other specific complaints in a complete review of systems (except as listed in HPI above).   Objective  Vitals:   03/17/21 0821  BP: 126/82  Pulse: 91  Resp: 16  SpO2: 100%  Weight: 261 lb (118.4 kg)  Height: 5\' 10"  (1.778 m)    Body mass index is 37.45 kg/m.  Physical Exam  Constitutional: Patient appears well-developed and well-nourished. Obese  No distress.  HEENT: head atraumatic, normocephalic, pupils equal and reactive to light, neck supple Cardiovascular: Normal rate, regular rhythm and normal heart sounds.  No murmur heard. No BLE edema. Pulmonary/Chest: Effort normal and breath sounds normal. No respiratory distress. Abdominal: Soft.  There is no tenderness. Skin: excoriation on arms, neck with some excoriation and dry spots  Psychiatric: Patient has a normal mood and affect. behavior is normal. Judgment and thought content normal.   PHQ2/9: Depression screen [facility] 2/9 03/17/2021 12/13/2020 09/10/2020 06/07/2020 03/08/2020   Decreased Interest 0 0 0 0 0  Down, Depressed, Hopeless 0 0 0 0 0  PHQ - 2 Score 0 0 0 0 0  Altered sleeping 0 0 - - -  Tired, decreased energy 0 0 - - -  Change in appetite 0 0 - - -  Feeling bad or failure about yourself  0 0 - - -  Trouble concentrating 0 0 - - -  Moving slowly or fidgety/restless 0 0 - - -  Suicidal thoughts 0 0 - - -  PHQ-9 Score 0 0 - - -  Difficult doing work/chores - Not difficult at all - - -  phq 9 is negative   Fall Risk: Fall Risk  03/17/2021 12/13/2020 09/10/2020 06/07/2020 03/08/2020  Falls in the past year? 0 0 0 0 0  Number falls in past yr: 0 0 0 0 0  Injury with Fall? 0 0 0 0 0  Risk for fall due to : No Fall Risks No Fall Risks No Fall Risks - -  Follow up Falls prevention discussed Falls prevention discussed Falls prevention discussed - -      Functional Status Survey: Is the patient deaf or have difficulty hearing?: No Does the patient have difficulty seeing, even when wearing glasses/contacts?: No Does the patient have difficulty concentrating, remembering, or making decisions?: No Does the patient have difficulty walking or climbing stairs?: No Does the patient have difficulty dressing or bathing?: No Does the patient have difficulty doing errands alone such as visiting a doctor's office or shopping?: No    Assessment & Plan  1. Dyslipidemia  - Alirocumab (PRALUENT) 150 MG/ML SOAJ; Inject 1 mL into the skin every 14 (fourteen) days.  Dispense: 2 mL; Refill: 5 - VASCEPA 1 g capsule; Take 2 capsules (2 g total) by mouth 2 (two) times daily.  Dispense: 360 capsule; Refill: 1  2. Microalbuminuria   3. Benign essential HTN  - Alirocumab (PRALUENT) 150 MG/ML SOAJ; Inject 1 mL into the skin every 14 (fourteen) days.  Dispense: 2 mL; Refill: 5  4. Morbid obesity with BMI of 45.0-49.9, adult (HCC)  - Alirocumab (PRALUENT) 150 MG/ML SOAJ; Inject 1 mL into the skin every 14 (fourteen) days.  Dispense: 2 mL; Refill: 5  5. Obstructive  apnea   6. Vitamin D deficiency   7. Need for shingles vaccine  - Varicella-zoster vaccine IM (Shingrix)  8. Dysmetabolic syndrome   9. Shift work sleep disorder

## 2021-03-17 ENCOUNTER — Encounter: Payer: Self-pay | Admitting: Family Medicine

## 2021-03-17 ENCOUNTER — Ambulatory Visit (INDEPENDENT_AMBULATORY_CARE_PROVIDER_SITE_OTHER): Payer: 59 | Admitting: Family Medicine

## 2021-03-17 VITALS — BP 126/82 | HR 91 | Resp 16 | Ht 70.0 in | Wt 261.0 lb

## 2021-03-17 DIAGNOSIS — Z23 Encounter for immunization: Secondary | ICD-10-CM | POA: Diagnosis not present

## 2021-03-17 DIAGNOSIS — I10 Essential (primary) hypertension: Secondary | ICD-10-CM

## 2021-03-17 DIAGNOSIS — R809 Proteinuria, unspecified: Secondary | ICD-10-CM

## 2021-03-17 DIAGNOSIS — E785 Hyperlipidemia, unspecified: Secondary | ICD-10-CM | POA: Diagnosis not present

## 2021-03-17 DIAGNOSIS — Z6841 Body Mass Index (BMI) 40.0 and over, adult: Secondary | ICD-10-CM

## 2021-03-17 DIAGNOSIS — E8881 Metabolic syndrome: Secondary | ICD-10-CM

## 2021-03-17 DIAGNOSIS — E559 Vitamin D deficiency, unspecified: Secondary | ICD-10-CM

## 2021-03-17 DIAGNOSIS — G4726 Circadian rhythm sleep disorder, shift work type: Secondary | ICD-10-CM

## 2021-03-17 DIAGNOSIS — G4733 Obstructive sleep apnea (adult) (pediatric): Secondary | ICD-10-CM

## 2021-03-17 MED ORDER — VASCEPA 1 G PO CAPS: 2.0000 g | ORAL_CAPSULE | Freq: Two times a day (BID) | ORAL | 1 refills | Status: DC

## 2021-03-17 MED ORDER — PRALUENT 150 MG/ML ~~LOC~~ SOAJ: 1.0000 mL | SUBCUTANEOUS | 5 refills | Status: DC

## 2021-04-01 ENCOUNTER — Telehealth: Payer: Self-pay

## 2021-04-01 ENCOUNTER — Other Ambulatory Visit: Payer: Self-pay

## 2021-04-01 DIAGNOSIS — E8881 Metabolic syndrome: Secondary | ICD-10-CM

## 2021-04-01 DIAGNOSIS — I10 Essential (primary) hypertension: Secondary | ICD-10-CM

## 2021-04-01 DIAGNOSIS — L239 Allergic contact dermatitis, unspecified cause: Secondary | ICD-10-CM

## 2021-04-01 DIAGNOSIS — R809 Proteinuria, unspecified: Secondary | ICD-10-CM

## 2021-04-01 DIAGNOSIS — E559 Vitamin D deficiency, unspecified: Secondary | ICD-10-CM

## 2021-04-01 DIAGNOSIS — E785 Hyperlipidemia, unspecified: Secondary | ICD-10-CM

## 2021-04-01 MED ORDER — HYDROXYZINE HCL 25 MG PO TABS: 25.0000 mg | ORAL_TABLET | Freq: Two times a day (BID) | ORAL | 1 refills | Status: DC | PRN

## 2021-04-01 MED ORDER — LORATADINE 10 MG PO TABS: 10.0000 mg | ORAL_TABLET | Freq: Every day | ORAL | 1 refills | Status: DC

## 2021-04-01 MED ORDER — EZETIMIBE 10 MG PO TABS: ORAL_TABLET | ORAL | 2 refills | Status: DC

## 2021-04-01 NOTE — Telephone Encounter (Signed)
Please change the pharmacy on this patients chart. It needs to be [facility]

## 2021-04-01 NOTE — Telephone Encounter (Signed)
done

## 2021-06-11 NOTE — Progress Notes (Signed)
Name: Isaac Delacruz   MRN: [id number]    DOB: 1965-08-03   Date:06/12/2021 ? ?     Progress Note ? ?Subjective ? ?Chief Complaint ? ?Follow Up ? ?HPI ? ?Morbid Obesity:BMI above 35 with co-morbidities such as HTN, dyslipidemia and OSA. He has been trying to walk but has been working long hours and only walking now about once a week. He continues to eat fast food twice a week, he packs his lunch the other days. He is still having sodas and chips at work, reminded him to pack healthy snacks.  ?  ?HTN: he is compliant with medication and bp is at goal  He denies  chest pain  or palpitation. No side effects  ?  ?Dyslipidemia: Last LDL was 144 , HDL still low at 31, we switched from Atorvastatin to Rosuvastatin 40 mg Feb 2021 and is also taking Zetia but last LDL still high at 148 and good cholesterol still low at 38  He is finally taking Vasepa and we will recheck labs before trying PA for Praluente  ? ?Pre-diabetes: he denies polyphagia, polydipsia or polyuria. He is back on Metformin since July 2020. A1C Feb 2022  was 6 %. , last A1C was down to 5.9 % 08/22 , we will recheck yearly  ?  ?Dermatitis: He goes to Dermatologist in  [place], not taking antibiotics, he states skin itches a lot a night and doing better with loratadine and hydroxizine , he states symptoms are stable.  Skin is dry and advised to increase moisturizer  ?  ?OSA: he wears every time he goes to bed, he keeps it on for about 5-6 hours . Unchanged  ? ?Toothache: going on for the past two weeks, he will see dentist this am, no fever or chills.  ? ?Patient Active Problem List  ? Diagnosis Date Noted  ? Enlarged lymph node 09/22/2018  ? Seasonal allergic rhinitis 08/18/2016  ? Folliculitis [id number]  ? Allergic dermatitis 05/23/2015  ? History of Bell's palsy 07/18/2014  ? Benign essential HTN 07/18/2014  ? Dyslipidemia 07/18/2014  ? LBP (low back pain) 07/18/2014  ? Vitamin D deficiency 07/18/2014  ? Dysmetabolic syndrome [id number]  ? Microalbuminuria  07/18/2014  ? Morbid obesity ([place]) 07/18/2014  ? Obstructive apnea 07/18/2014  ? Tobacco use 07/18/2014  ? ? ?Past Surgical History:  ?Procedure Laterality Date  ? COLONOSCOPY    ? COLONOSCOPY WITH PROPOFOL N/A 11/11/2016  ? Procedure: COLONOSCOPY WITH PROPOFOL;  Surgeon: Lin Landsman, MD;  Location: [facility];  Service: Endoscopy;  Laterality: N/A;  sleep apnea  ? ? ?Family History  ?Problem Relation Age of Onset  ? Cancer Mother 63  ?     Lung  ? Cancer Father   ?     Prostate  ? Diabetes Father   ? Hypertension Brother   ? Cancer Maternal Aunt   ?     Lung  ? Heart disease Brother   ? ? ?Social History  ? ?Tobacco Use  ? Smoking status: Some Days  ?  Types: Cigars  ?  Start date: 10/12/1997  ? Smokeless tobacco: Never  ? Tobacco comments:  ?  Smokes 2 cigars daily  ?Substance Use Topics  ? Alcohol use: Yes  ?  Alcohol/week: 0.0 standard drinks  ?  Comment: rarely  ? ? ? ?Current Outpatient Medications:  ?  aspirin 81 MG tablet, Take by mouth., Disp: , Rfl:  ?  ezetimibe (ZETIA) 10 MG tablet, TAKE ONE  TABLET BY MOUTH ONCE DAILY WITH ROSUVASTATIN FOR CHOLESTEROL, Disp: 90 tablet, Rfl: 2 ?  fluticasone (FLONASE) 50 MCG/ACT nasal spray, INHALE 2 SPRAYS IN EACH NOSTRIL ONCE A DAY, Disp: 16 g, Rfl: 2 ?  hydrOXYzine (ATARAX) 25 MG tablet, Take 1 tablet (25 mg total) by mouth 2 (two) times daily as needed., Disp: 180 tablet, Rfl: 1 ?  lisinopril (ZESTRIL) 10 MG tablet, Take 1 tablet (10 mg total) by mouth daily., Disp: 90 tablet, Rfl: 1 ?  loratadine (CLARITIN) 10 MG tablet, Take 1 tablet (10 mg total) by mouth daily., Disp: 90 tablet, Rfl: 1 ?  metFORMIN (GLUCOPHAGE) 500 MG tablet, Take 1 tablet (500 mg total) by mouth 2 (two) times daily with a meal., Disp: 180 tablet, Rfl: 1 ?  rosuvastatin (CRESTOR) 40 MG tablet, Take 1 tablet (40 mg total) by mouth daily. In place of Atorvastatin, Disp: 90 tablet, Rfl: 1 ?  VASCEPA 1 g capsule, Take 2 capsules (2 g total) by mouth 2 (two) times daily., Disp: 360  capsule, Rfl: 1 ?  Vitamin D, Ergocalciferol, (DRISDOL) 1.25 MG (50000 UNIT) CAPS capsule, Take 1 capsule (50,000 Units total) by mouth every 7 (seven) days., Disp: 12 capsule, Rfl: 1 ? ?No Known Allergies ? ?I personally reviewed active problem list, medication list, allergies, family history, social history, health maintenance with the patient/caregiver today. ? ? ?ROS ? ?Constitutional: Negative for fever or weight change.  ?Respiratory: Negative for cough and shortness of breath.   ?Cardiovascular: Negative for chest pain or palpitations.  ?Gastrointestinal: Negative for abdominal pain, no bowel changes.  ?Musculoskeletal: Negative for gait problem or joint swelling.  ?Skin: Positive  for rash.  ?Neurological: Negative for dizziness or headache.  ?No other specific complaints in a complete review of systems (except as listed in HPI above).  ? ?Objective ? ?Vitals:  ? 06/12/21 0804  ?BP: 138/80  ?Pulse: 94  ?Resp: 16  ?SpO2: 99%  ?Weight: 262 lb (118.8 kg)  ?Height: 5\' 10"  (1.778 m)  ? ? ?Body mass index is 37.59 kg/m?. ? ?Physical Exam ? ?Constitutional: Patient appears well-developed and well-nourished. Obese  No distress.  ?HEENT: head atraumatic, normocephalic, pupils equal and reactive to light, neck supple, two lower molars are broken, no signs of infection  ?Cardiovascular: Normal rate, regular rhythm and normal heart sounds.  No murmur heard. No BLE edema. ?Pulmonary/Chest: Effort normal and breath sounds normal. No respiratory distress. ?Abdominal: Soft.  There is no tenderness. ?Skin: scars from pruritis /dermatitis but no active lesions ?Psychiatric: Patient has a normal mood and affect. behavior is normal. Judgment and thought content normal.  ? ?PHQ2/9: ? ?  06/12/2021  ?  8:04 AM 03/17/2021  ?  8:20 AM 12/13/2020  ?  7:59 AM 09/10/2020  ?  8:24 AM 06/07/2020  ?  7:47 AM  ?Depression screen PHQ 2/9  ?Decreased Interest 0 0 0 0 0  ?Down, Depressed, Hopeless 0 0 0 0 0  ?PHQ - 2 Score 0 0 0 0 0  ?Altered  sleeping 0 0 0    ?Tired, decreased energy 0 0 0    ?Change in appetite 0 0 0    ?Feeling bad or failure about yourself  0 0 0    ?Trouble concentrating 0 0 0    ?Moving slowly or fidgety/restless 0 0 0    ?Suicidal thoughts 0 0 0    ?PHQ-9 Score 0 0 0    ?Difficult doing work/chores   Not difficult at all    ?  ?  phq 9 is negative ? ? ?Fall Risk: ? ?  06/12/2021  ?  8:03 AM 03/17/2021  ?  8:20 AM 12/13/2020  ?  7:59 AM 09/10/2020  ?  8:24 AM 06/07/2020  ?  7:47 AM  ?Fall Risk   ?Falls in the past year? 0 0 0 0 0  ?Number falls in past yr: 0 0 0 0 0  ?Injury with Fall? 0 0 0 0 0  ?Risk for fall due to : No Fall Risks No Fall Risks No Fall Risks No Fall Risks   ?Follow up Falls prevention discussed Falls prevention discussed Falls prevention discussed Falls prevention discussed   ? ? ? ? ?Functional Status Survey: ?Is the patient deaf or have difficulty hearing?: No ?Does the patient have difficulty seeing, even when wearing glasses/contacts?: No ?Does the patient have difficulty concentrating, remembering, or making decisions?: No ?Does the patient have difficulty walking or climbing stairs?: No ?Does the patient have difficulty dressing or bathing?: No ?Does the patient have difficulty doing errands alone such as visiting a doctor's office or shopping?: No ? ? ? ?Assessment & Plan ? ?Problem List Items Addressed This Visit   ? ? Benign essential HTN  ?  Still at goal  ? ?  ?  ? Relevant Medications  ? lisinopril (ZESTRIL) 10 MG tablet  ? rosuvastatin (CRESTOR) 40 MG tablet  ? VASCEPA 1 g capsule  ? Morbid obesity ([place]) - Primary  ?  Discussed medication for weight loss, but not interested  ?Advised to stop drinking sodas  ? ?  ?  ? Relevant Medications  ? metFORMIN (GLUCOPHAGE) 500 MG tablet  ? Obstructive apnea  ?  Compliant  ? ?  ?  ? Tobacco use  ?  Discussed importance of quitting smoking  ?Down to one cigar daily  ? ?  ?  ? Allergic dermatitis  ?  Under the care of Dermatologist  ? ?  ?  ? Seasonal allergic rhinitis   ?  Taking loratadine  ? ?  ?  ? Folliculitis  ? Dyslipidemia  ? Relevant Medications  ? rosuvastatin (CRESTOR) 40 MG tablet  ? VASCEPA 1 g capsule  ? Other Relevant Orders  ? Lipid panel  ? Vitamin D deficiency

## 2021-06-12 ENCOUNTER — Encounter: Payer: Self-pay | Admitting: Family Medicine

## 2021-06-12 ENCOUNTER — Ambulatory Visit (INDEPENDENT_AMBULATORY_CARE_PROVIDER_SITE_OTHER): Payer: 59 | Admitting: Family Medicine

## 2021-06-12 DIAGNOSIS — E559 Vitamin D deficiency, unspecified: Secondary | ICD-10-CM

## 2021-06-12 DIAGNOSIS — R809 Proteinuria, unspecified: Secondary | ICD-10-CM | POA: Diagnosis not present

## 2021-06-12 DIAGNOSIS — E785 Hyperlipidemia, unspecified: Secondary | ICD-10-CM

## 2021-06-12 DIAGNOSIS — Z720 Tobacco use: Secondary | ICD-10-CM

## 2021-06-12 DIAGNOSIS — G4733 Obstructive sleep apnea (adult) (pediatric): Secondary | ICD-10-CM

## 2021-06-12 DIAGNOSIS — E8881 Metabolic syndrome: Secondary | ICD-10-CM

## 2021-06-12 DIAGNOSIS — L239 Allergic contact dermatitis, unspecified cause: Secondary | ICD-10-CM

## 2021-06-12 DIAGNOSIS — J301 Allergic rhinitis due to pollen: Secondary | ICD-10-CM

## 2021-06-12 DIAGNOSIS — I10 Essential (primary) hypertension: Secondary | ICD-10-CM | POA: Diagnosis not present

## 2021-06-12 DIAGNOSIS — L739 Follicular disorder, unspecified: Secondary | ICD-10-CM

## 2021-06-12 DIAGNOSIS — K0889 Other specified disorders of teeth and supporting structures: Secondary | ICD-10-CM

## 2021-06-12 MED ORDER — METFORMIN HCL 500 MG PO TABS: 500.0000 mg | ORAL_TABLET | Freq: Two times a day (BID) | ORAL | 1 refills | Status: DC

## 2021-06-12 MED ORDER — VASCEPA 1 G PO CAPS: 2.0000 g | ORAL_CAPSULE | Freq: Two times a day (BID) | ORAL | 1 refills | Status: DC

## 2021-06-12 MED ORDER — ROSUVASTATIN CALCIUM 40 MG PO TABS: 40.0000 mg | ORAL_TABLET | Freq: Every day | ORAL | 1 refills | Status: DC

## 2021-06-12 MED ORDER — LISINOPRIL 10 MG PO TABS: 10.0000 mg | ORAL_TABLET | Freq: Every day | ORAL | 1 refills | Status: DC

## 2021-06-12 MED ORDER — FLUTICASONE PROPIONATE 50 MCG/ACT NA SUSP: 1.0000 | Freq: Every day | NASAL | 2 refills | Status: DC

## 2021-06-12 MED ORDER — VITAMIN D (ERGOCALCIFEROL) 1.25 MG (50000 UNIT) PO CAPS: 50000.0000 [IU] | ORAL_CAPSULE | ORAL | 1 refills | Status: DC

## 2021-06-12 NOTE — Assessment & Plan Note (Signed)
Discussed importance of quitting smoking  ?Down to one cigar daily  ?

## 2021-06-12 NOTE — Assessment & Plan Note (Signed)
-

## 2021-06-12 NOTE — Assessment & Plan Note (Signed)
Under the care of Dermatologist  ?

## 2021-06-12 NOTE — Assessment & Plan Note (Signed)
Discussed medication for weight loss, but not interested  ?Advised to stop drinking sodas  ?

## 2021-06-12 NOTE — Assessment & Plan Note (Signed)
Still at goal  ?

## 2021-06-12 NOTE — Assessment & Plan Note (Signed)
Compliant 

## 2021-06-13 ENCOUNTER — Other Ambulatory Visit: Payer: Self-pay | Admitting: Family Medicine

## 2021-06-13 DIAGNOSIS — E7849 Other hyperlipidemia: Secondary | ICD-10-CM

## 2021-06-13 LAB — LIPID PANEL
Cholesterol: 200 mg/dL — ABNORMAL HIGH (ref <200)
HDL: 32 mg/dL — ABNORMAL LOW (ref >=40)
LDL Cholesterol (Calc): 137 mg/dL (calc) — ABNORMAL HIGH
Non-HDL Cholesterol (Calc): 168 mg/dL (calc) — ABNORMAL HIGH (ref <130)
Total CHOL/HDL Ratio: 6.3 (calc) — ABNORMAL HIGH (ref <5.0)
Triglycerides: 172 mg/dL — ABNORMAL HIGH (ref <150)

## 2021-06-13 LAB — HM DIABETES EYE EXAM

## 2021-06-13 MED ORDER — REPATHA 140 MG/ML ~~LOC~~ SOSY: 1.0000 | PREFILLED_SYRINGE | SUBCUTANEOUS | 5 refills | Status: DC

## 2021-06-25 ENCOUNTER — Ambulatory Visit: Payer: Self-pay | Admitting: *Deleted

## 2021-06-25 NOTE — Telephone Encounter (Signed)
Called patient with no answer. Left voicemail

## 2021-06-25 NOTE — Telephone Encounter (Signed)
Summary: medication question   Pt reports that he finally received his Rx and would like to ask the nurse when he should start the medication. Pt requests call back asap to advise. Cb# [phone]       Called patient to review medication question. Last ordered medication was repatha. No answer. Left voicemail medication request will be sent to PCP for advise.    Reason for Disposition  [1] Caller has URGENT medicine question about med that PCP or specialist prescribed AND [2] triager unable to answer question  Answer Assessment - Initial Assessment Questions 1. NAME of MEDICATION: "What medicine are you calling about?" Unable to speak with patient left message medication question will be sent to PCP      repatha 2. QUESTION: "What is your question?" (e.g., double dose of medicine, side effect)     When can patient start taking medication? 3. PRESCRIBING HCP: "Who prescribed it?" Reason: if prescribed by specialist, call should be referred to that group.     PCP 4. SYMPTOMS: "Do you have any symptoms?"     na 5. SEVERITY: If symptoms are present, ask "Are they mild, moderate or severe?"     na 6. PREGNANCY:  "Is there any chance that you are pregnant?" "When was your last menstrual period?"     na  Protocols used: Medication Question Call-A-AH

## 2021-09-16 NOTE — Progress Notes (Unsigned)
Name: Isaac Delacruz   MRN: [id number]    DOB: 28-Oct-1965   Date:09/17/2021       Progress Note  Subjective  Chief Complaint  Annual Exam  HPI  Patient presents for annual CPE.  IPSS Questionnaire (AUA-7): Over the past month.   1)  How often have you had a sensation of not emptying your bladder completely after you finish urinating?  0 - Not at all  2)  How often have you had to urinate again less than two hours after you finished urinating? 0 - Not at all  3)  How often have you found you stopped and started again several times when you urinated?  0 - Not at all  4) How difficult have you found it to postpone urination?  0 - Not at all  5) How often have you had a weak urinary stream?  0 - Not at all  6) How often have you had to push or strain to begin urination?  0 - Not at all  7) How many times did you most typically get up to urinate from the time you went to bed until the time you got up in the morning?  1 - 1 time  Total score:  0-7 mildly symptomatic   8-19 moderately symptomatic   20-35 severely symptomatic     Diet: cutting down on sodas, by 50 %, eating less snacks, drinking more water  Exercise: he is very active at work  Last Dental Exam: recently  Last Eye Exam: recently   Depression: phq 9 is negative    09/17/2021    7:40 AM 06/12/2021    8:04 AM 03/17/2021    8:20 AM 12/13/2020    7:59 AM 09/10/2020    8:24 AM  Depression screen PHQ 2/9  Decreased Interest 0 0 0 0 0  Down, Depressed, Hopeless 0 0 0 0 0  PHQ - 2 Score 0 0 0 0 0  Altered sleeping 0 0 0 0   Tired, decreased energy 0 0 0 0   Change in appetite 0 0 0 0   Feeling bad or failure about yourself  0 0 0 0   Trouble concentrating 0 0 0 0   Moving slowly or fidgety/restless 0 0 0 0   Suicidal thoughts 0 0 0 0   PHQ-9 Score 0 0 0 0   Difficult doing work/chores    Not difficult at all     Hypertension:  BP Readings from Last 3 Encounters:  09/17/21 126/78  06/12/21 138/80  03/17/21 126/82     Obesity: Wt Readings from Last 3 Encounters:  09/17/21 249 lb (112.9 kg)  06/12/21 262 lb (118.8 kg)  03/17/21 261 lb (118.4 kg)   BMI Readings from Last 3 Encounters:  09/17/21 36.77 kg/m  06/12/21 37.59 kg/m  03/17/21 37.45 kg/m     Lipids:  Lab Results  Component Value Date   CHOL 200 (H) 06/12/2021   CHOL 210 (H) 09/10/2020   CHOL 209 (H) 03/08/2020   Lab Results  Component Value Date   HDL 32 (L) 06/12/2021   HDL 38 (L) 09/10/2020   HDL 31 (L) 03/08/2020   Lab Results  Component Value Date   LDLCALC 137 (H) 06/12/2021   LDLCALC 148 (H) 09/10/2020   LDLCALC 144 (H) 03/08/2020   Lab Results  Component Value Date   TRIG 172 (H) 06/12/2021   TRIG 122 09/10/2020   TRIG 200 (H) 03/08/2020   Lab  Results  Component Value Date   CHOLHDL 6.3 (H) 06/12/2021   CHOLHDL 5.5 (H) 09/10/2020   CHOLHDL 6.7 (H) 03/08/2020   No results found for: "LDLDIRECT" Glucose:  Glucose, Bld  Date Value Ref Range Status  09/10/2020 88 65 - 99 mg/dL Final    Comment:    .            Fasting reference interval .   03/08/2020 96 65 - 99 mg/dL Final    Comment:    .            Fasting reference interval .   09/06/2019 91 65 - 99 mg/dL Final    Comment:    .            Fasting reference interval .    Glucose-Capillary  Date Value Ref Range Status  12/07/2018 83 70 - 99 mg/dL Final  44/31/5400 94 65 - 99 mg/dL Final    Flowsheet Row Office Visit from 09/10/2020 in [facility]  AUDIT-C Score 1      Domestic Partner STD testing and prevention (HIV/chl/gon/syphilis): 09/09/16 Sexual history: one person for years.  Hep C Screening: 09/09/16 Skin cancer: under the care of dermatologist  Colorectal cancer: 11/11/16 Prostate cancer:   Lab Results  Component Value Date   PSA 0.80 09/10/2020   PSA 0.6 09/06/2019   PSA 0.6 08/23/2018     Lung cancer:  Low Dose CT Chest recommended if Age 64-80 years, 30 pack-year currently smoking OR have quit  w/in 15years. Patient  no a candidate for screening   ECG:  11/22/15  Vaccines:   Tdap: up to date Shingrix: up to date Pneumonia: today  Flu: yearly  COVID-19: up to date  Advanced Care Planning: A voluntary discussion about advance care planning including the explanation and discussion of advance directives.  Discussed health care proxy and Living will, and the patient was able to identify a health care proxy as his partner - Rohaan Durnil.  Patient does not have a living will and power of attorney of health care   Patient Active Problem List   Diagnosis Date Noted   Enlarged lymph node 09/22/2018   Seasonal allergic rhinitis 08/18/2016   Folliculitis 05/23/2015   Allergic dermatitis 05/23/2015   History of Bell's palsy 07/18/2014   Benign essential HTN 07/18/2014   Dyslipidemia 07/18/2014   LBP (low back pain) 07/18/2014   Vitamin D deficiency 07/18/2014   Dysmetabolic syndrome 07/18/2014   Microalbuminuria 07/18/2014   Morbid obesity ([facility]) 07/18/2014   Obstructive apnea 07/18/2014   Tobacco use 07/18/2014    Past Surgical History:  Procedure Laterality Date   COLONOSCOPY     COLONOSCOPY WITH PROPOFOL N/A 11/11/2016   Procedure: COLONOSCOPY WITH PROPOFOL;  Surgeon: Toney Reil, MD;  Location: [facility];  Service: Endoscopy;  Laterality: N/A;  sleep apnea    Family History  Problem Relation Age of Onset   Cancer Mother 39       Lung   Cancer Father        Prostate   Diabetes Father    Hypertension Brother    Cancer Maternal Aunt        Lung   Heart disease Brother     Social History   Socioeconomic History   Marital status: Media planner    Spouse name: Dorris    Number of children: 7   Years of education: Not on file   Highest education level: 11th grade  Occupational History   Occupation: Scientist, physiological at the dye [employer]   Tobacco Use   Smoking status: Some Days    Types: Cigars    Start date: 10/12/1997   Smokeless tobacco: Never    Tobacco comments:    Smokes 2 cigars daily  Vaping Use   Vaping Use: Never used  Substance and Sexual Activity   Alcohol use: Yes    Alcohol/week: 0.0 standard drinks of alcohol    Comment: rarely   Drug use: No   Sexual activity: Yes    Partners: Female  Other Topics Concern   Not on file  Social History Narrative   Works full time at [employer] in the dye room    Lives with wife  and they have 3 children together, just one child at home    Social Determinants of Health   Financial Resource Strain: Low Risk  (09/17/2021)   Overall Financial Resource Strain (CARDIA)    Difficulty of Paying Living Expenses: Not hard at all  Food Insecurity: No Food Insecurity (09/17/2021)   Hunger Vital Sign    Worried About Running Out of Food in the Last Year: Never true    Ran Out of Food in the Last Year: Never true  Transportation Needs: No Transportation Needs (09/17/2021)   PRAPARE - Administrator, Civil Service (Medical): No    Lack of Transportation (Non-Medical): No  Physical Activity: Insufficiently Active (09/17/2021)   Exercise Vital Sign    Days of Exercise per Week: 2 days    Minutes of Exercise per Session: 60 min  Stress: No Stress Concern Present (09/17/2021)   [employer] of Occupational Health - Occupational Stress Questionnaire    Feeling of Stress : Not at all  Social Connections: Moderately Isolated (09/17/2021)   Social Connection and Isolation Panel [NHANES]    Frequency of Communication with Friends and Family: Twice a week    Frequency of Social Gatherings with Friends and Family: More than three times a week    Attends Religious Services: More than 4 times per year    Active Member of [employer] or Organizations: No    Attends Banker Meetings: Never    Marital Status: Separated  Intimate Partner Violence: Not At Risk (09/17/2021)   Humiliation, Afraid, Rape, and Kick questionnaire    Fear of Current or Ex-Partner: No    Emotionally  Abused: No    Physically Abused: No    Sexually Abused: No     Current Outpatient Medications:    aspirin 81 MG tablet, Take by mouth., Disp: , Rfl:    Evolocumab (REPATHA) 140 MG/ML SOSY, Inject 1 each into the skin every 14 (fourteen) days., Disp: 2.1 mL, Rfl: 5   ezetimibe (ZETIA) 10 MG tablet, TAKE ONE TABLET BY MOUTH ONCE DAILY WITH ROSUVASTATIN FOR CHOLESTEROL, Disp: 90 tablet, Rfl: 2   hydrOXYzine (ATARAX) 25 MG tablet, Take 1 tablet (25 mg total) by mouth 2 (two) times daily as needed., Disp: 180 tablet, Rfl: 1   lisinopril (ZESTRIL) 10 MG tablet, Take 1 tablet (10 mg total) by mouth daily., Disp: 90 tablet, Rfl: 1   loratadine (CLARITIN) 10 MG tablet, Take 1 tablet (10 mg total) by mouth daily., Disp: 90 tablet, Rfl: 1   metFORMIN (GLUCOPHAGE) 500 MG tablet, Take 1 tablet (500 mg total) by mouth 2 (two) times daily with a meal., Disp: 180 tablet, Rfl: 1   rosuvastatin (CRESTOR) 40 MG tablet, Take 1 tablet (40 mg  total) by mouth daily. In place of Atorvastatin, Disp: 90 tablet, Rfl: 1   VASCEPA 1 g capsule, Take 2 capsules (2 g total) by mouth 2 (two) times daily., Disp: 360 capsule, Rfl: 1   Vitamin D, Ergocalciferol, (DRISDOL) 1.25 MG (50000 UNIT) CAPS capsule, Take 1 capsule (50,000 Units total) by mouth every 7 (seven) days., Disp: 12 capsule, Rfl: 1  No Known Allergies   ROS  Constitutional: Negative for fever or weight change.  Respiratory: Negative for cough and shortness of breath.   Cardiovascular: Negative for chest pain or palpitations.  Gastrointestinal: Negative for abdominal pain, no bowel changes.  Musculoskeletal: Negative for gait problem or joint swelling.  Skin:positive for rash and lichen on lower extremity  Neurological: Negative for dizziness or headache.  No other specific complaints in a complete review of systems (except as listed in HPI above).    Objective  Vitals:   09/17/21 0741  BP: 126/78  Pulse: 94  Resp: 16  SpO2: 98%  Weight: 249 lb  (112.9 kg)  Height: 5\' 9"  (1.753 m)    Body mass index is 36.77 kg/m.  Physical Exam  Constitutional: Patient appears well-developed and well-nourished. No distress.  HENT: Head: Normocephalic and atraumatic. Ears: B TMs ok, no erythema or effusion; Nose: Nose normal. Mouth/Throat: Oropharynx is clear and moist. No oropharyngeal exudate.  Eyes: Conjunctivae and EOM are normal. Pupils are equal, round, and reactive to light. No scleral icterus.  Neck: Normal range of motion. Neck supple. No JVD present. No thyromegaly present.  Cardiovascular: Normal rate, regular rhythm and normal heart sounds.  No murmur heard. No BLE edema. Pulmonary/Chest: Effort normal and breath sounds normal. No respiratory distress. Abdominal: Soft. Bowel sounds are normal, no distension. There is no tenderness. no masses MALE GENITALIA: Normal descended testes bilaterally, no masses palpated, no hernias, no lesions, no discharge RECTAL: normal rectal exam  Musculoskeletal: Normal range of motion, no joint effusions. No gross deformities Neurological: he is alert and oriented to person, place, and time. No cranial nerve deficit. Coordination, balance, strength, speech and gait are normal.  Skin: Skin is warm and dry. lichen on lower legs, scarring from scratching, he has eczema Psychiatric: Patient has a normal mood and affect. behavior is normal. Judgment and thought content normal.   Fall Risk:    09/17/2021    7:40 AM 06/12/2021    8:03 AM 03/17/2021    8:20 AM 12/13/2020    7:59 AM 09/10/2020    8:24 AM  Fall Risk   Falls in the past year? 0 0 0 0 0  Number falls in past yr: 0 0 0 0 0  Injury with Fall? 0 0 0 0 0  Risk for fall due to : No Fall Risks No Fall Risks No Fall Risks No Fall Risks No Fall Risks  Follow up Falls prevention discussed Falls prevention discussed Falls prevention discussed Falls prevention discussed Falls prevention discussed     Functional Status Survey: Is the patient deaf or  have difficulty hearing?: No Does the patient have difficulty seeing, even when wearing glasses/contacts?: No Does the patient have difficulty concentrating, remembering, or making decisions?: No Does the patient have difficulty walking or climbing stairs?: No Does the patient have difficulty dressing or bathing?: No Does the patient have difficulty doing errands alone such as visiting a doctor's office or shopping?: No    Assessment & Plan   1. Well adult exam  - Lipid panel - CBC with Differential/Platelet - COMPLETE  METABOLIC PANEL WITH GFR - Hemoglobin A1c - VITAMIN D 25 Hydroxy (Vit-D Deficiency, Fractures) - PSA  2. Need for pneumococcal 20-valent conjugate vaccination  - Pneumococcal conjugate vaccine 20-valent (Prevnar 20)  3. Benign essential HTN  - CBC with Differential/Platelet - COMPLETE METABOLIC PANEL WITH GFR  4. Familial hyperlipidemia  - Lipid panel  5. Vitamin D deficiency  - VITAMIN D 25 Hydroxy (Vit-D Deficiency, Fractures)  6. Dysmetabolic syndrome  - Hemoglobin A1c  7. Family history of prostate cancer  - PSA   -Prostate cancer screening and PSA options (with potential risks and benefits of testing vs not testing) were discussed along with recent recs/guidelines. -USPSTF grade A and B recommendations reviewed with patient; age-appropriate recommendations, preventive care, screening tests, etc discussed and encouraged; healthy living encouraged; see AVS for patient education given to patient -Discussed importance of 150 minutes of physical activity weekly, eat two servings of fish weekly, eat one serving of tree nuts ( cashews, pistachios, pecans, almonds.Marland Kitchen.) every other day, eat 6 servings of fruit/vegetables daily and drink plenty of water and avoid sweet beverages.  -Reviewed Health Maintenance: yes

## 2021-09-16 NOTE — Patient Instructions (Signed)

## 2021-09-17 ENCOUNTER — Encounter: Payer: Self-pay | Admitting: Family Medicine

## 2021-09-17 ENCOUNTER — Ambulatory Visit (INDEPENDENT_AMBULATORY_CARE_PROVIDER_SITE_OTHER): Payer: 59 | Admitting: Family Medicine

## 2021-09-17 VITALS — BP 126/78 | HR 94 | Resp 16 | Ht 69.0 in | Wt 249.0 lb

## 2021-09-17 DIAGNOSIS — I10 Essential (primary) hypertension: Secondary | ICD-10-CM | POA: Diagnosis not present

## 2021-09-17 DIAGNOSIS — E7849 Other hyperlipidemia: Secondary | ICD-10-CM

## 2021-09-17 DIAGNOSIS — E8881 Metabolic syndrome: Secondary | ICD-10-CM

## 2021-09-17 DIAGNOSIS — E559 Vitamin D deficiency, unspecified: Secondary | ICD-10-CM

## 2021-09-17 DIAGNOSIS — Z8042 Family history of malignant neoplasm of prostate: Secondary | ICD-10-CM

## 2021-09-17 DIAGNOSIS — Z0000 Encounter for general adult medical examination without abnormal findings: Secondary | ICD-10-CM | POA: Diagnosis not present

## 2021-09-17 DIAGNOSIS — Z23 Encounter for immunization: Secondary | ICD-10-CM | POA: Diagnosis not present

## 2021-09-18 LAB — COMPLETE METABOLIC PANEL WITH GFR
AG Ratio: 1.2 (calc) (ref 1.0–2.5)
ALT: 12 U/L (ref 9–46)
AST: 18 U/L (ref 10–35)
Albumin: 4.4 g/dL (ref 3.6–5.1)
Alkaline phosphatase (APISO): 64 U/L (ref 35–144)
BUN: 16 mg/dL (ref 7–25)
CO2: 23 mmol/L (ref 20–32)
Calcium: 9.6 mg/dL (ref 8.6–10.3)
Chloride: 106 mmol/L (ref 98–110)
Creat: 1.2 mg/dL (ref 0.70–1.30)
Globulin: 3.6 g/dL (calc) (ref 1.9–3.7)
Glucose, Bld: 97 mg/dL (ref 65–99)
Potassium: 4 mmol/L (ref 3.5–5.3)
Sodium: 139 mmol/L (ref 135–146)
Total Bilirubin: 0.7 mg/dL (ref 0.2–1.2)
Total Protein: 8 g/dL (ref 6.1–8.1)
eGFR: 72 mL/min/{1.73_m2} (ref >=60)

## 2021-09-18 LAB — CBC WITH DIFFERENTIAL/PLATELET
Absolute Monocytes: 845 cells/uL (ref 200–950)
Basophils Absolute: 73 cells/uL (ref 0–200)
Basophils Relative: 1.1 %
Eosinophils Absolute: 310 cells/uL (ref 15–500)
Eosinophils Relative: 4.7 %
HCT: 48.5 % (ref 38.5–50.0)
Hemoglobin: 16.1 g/dL (ref 13.2–17.1)
Lymphs Abs: 1907 cells/uL (ref 850–3900)
MCH: 28.5 pg (ref 27.0–33.0)
MCHC: 33.2 g/dL (ref 32.0–36.0)
MCV: 86 fL (ref 80.0–100.0)
MPV: 11.5 fL (ref 7.5–12.5)
Monocytes Relative: 12.8 %
Neutro Abs: 3465 cells/uL (ref 1500–7800)
Neutrophils Relative %: 52.5 %
Platelets: 214 10*3/uL (ref 140–400)
RBC: 5.64 10*6/uL (ref 4.20–5.80)
RDW: 14.5 % (ref 11.0–15.0)
Total Lymphocyte: 28.9 %
WBC: 6.6 10*3/uL (ref 3.8–10.8)

## 2021-09-18 LAB — LIPID PANEL
Cholesterol: 226 mg/dL — ABNORMAL HIGH (ref <200)
HDL: 34 mg/dL — ABNORMAL LOW (ref >=40)
LDL Cholesterol (Calc): 160 mg/dL (calc) — ABNORMAL HIGH
Non-HDL Cholesterol (Calc): 192 mg/dL (calc) — ABNORMAL HIGH (ref <130)
Total CHOL/HDL Ratio: 6.6 (calc) — ABNORMAL HIGH (ref <5.0)
Triglycerides: 172 mg/dL — ABNORMAL HIGH (ref <150)

## 2021-09-18 LAB — HEMOGLOBIN A1C
Hgb A1c MFr Bld: 6 % of total Hgb — ABNORMAL HIGH (ref <5.7)
Mean Plasma Glucose: 126 mg/dL
eAG (mmol/L): 7 mmol/L

## 2021-09-18 LAB — VITAMIN D 25 HYDROXY (VIT D DEFICIENCY, FRACTURES): Vit D, 25-Hydroxy: 16 ng/mL — ABNORMAL LOW (ref 30–100)

## 2021-09-18 LAB — PSA: PSA: 0.86 ng/mL (ref <=4.00)

## 2021-09-22 ENCOUNTER — Other Ambulatory Visit: Payer: Self-pay

## 2021-09-22 DIAGNOSIS — E785 Hyperlipidemia, unspecified: Secondary | ICD-10-CM

## 2021-09-22 NOTE — Progress Notes (Signed)
Order placed for 3 months

## 2021-12-08 NOTE — Progress Notes (Unsigned)
Name: Isaac Delacruz   MRN: [id number]    DOB: 1965/05/14   Date:12/09/2021       Progress Note  Subjective  Chief Complaint  Follow up   HPI  Morbid Obesity:BMI above 23 with co-morbidities such as HTN, dyslipidemia and OSA. He is active at work. He continues to eat fast food twice a week, he packs his lunch the other days. He is still having sodas and chips at work, he has not changed that yet, must stop drinking sodas    HTN: he is compliant with medication, he just left work and bp is 130/70He denies  chest pain  or palpitation. No side effects .   Dyslipidemia: Last LDL was up to 160 at the time he was on Rosuvastatin , vascepa  and zetia , he was approved for Repatha and is using it every 2 weeks and tolerating it well, he states he wants to come back fasting for labs.   Pre-diabetes: he denies polyphagia, polydipsia or polyuria. He is back on Metformin since July 2020.A1C has been stable, last level was 09/2021 at 6 %    Dermatitis: He goes to Dermatologist in  [place], not taking antibiotics, he states skin itches a lot a night and doing better with loratadine and hydroxizine , he states symptoms are stable.  Skin is dry and advised to increase moisturizer Unchanged    OSA: he wears every time he goes to bed, he keeps it on for about 5-6 hours . No snoring. Continue CPAP   Patient Active Problem List   Diagnosis Date Noted   Enlarged lymph node 09/22/2018   Seasonal allergic rhinitis 76/16/0737   Folliculitis 10/62/6948   Allergic dermatitis 05/23/2015   History of Bell's palsy 07/18/2014   Benign essential HTN 07/18/2014   Dyslipidemia 07/18/2014   LBP (low back pain) 07/18/2014   Vitamin D deficiency 54/62/7035   Dysmetabolic syndrome 00/93/8182   Microalbuminuria 07/18/2014   Morbid obesity ([place]) 07/18/2014   Obstructive apnea 07/18/2014   Tobacco use 07/18/2014    Past Surgical History:  Procedure Laterality Date   COLONOSCOPY     COLONOSCOPY WITH PROPOFOL N/A  11/11/2016   Procedure: COLONOSCOPY WITH PROPOFOL;  Surgeon: Lin Landsman, MD;  Location: [facility];  Service: Endoscopy;  Laterality: N/A;  sleep apnea    Family History  Problem Relation Age of Onset   Cancer Mother 68       Lung   Cancer Father        Prostate   Diabetes Father    Hypertension Brother    Cancer Maternal Aunt        Lung   Heart disease Brother     Social History   Tobacco Use   Smoking status: Some Days    Types: Cigars    Start date: 10/12/1997   Smokeless tobacco: Never   Tobacco comments:    Smokes 2 cigars daily  Substance Use Topics   Alcohol use: Yes    Alcohol/week: 0.0 standard drinks of alcohol    Comment: rarely     Current Outpatient Medications:    aspirin 81 MG tablet, Take by mouth., Disp: , Rfl:    Evolocumab (REPATHA) 140 MG/ML SOSY, Inject 1 each into the skin every 14 (fourteen) days., Disp: 2.1 mL, Rfl: 5   ezetimibe (ZETIA) 10 MG tablet, TAKE ONE TABLET BY MOUTH ONCE DAILY WITH ROSUVASTATIN FOR CHOLESTEROL, Disp: 90 tablet, Rfl: 2   hydrOXYzine (ATARAX) 25 MG tablet, Take  1 tablet (25 mg total) by mouth 2 (two) times daily as needed., Disp: 180 tablet, Rfl: 1   lisinopril (ZESTRIL) 10 MG tablet, Take 1 tablet (10 mg total) by mouth daily., Disp: 90 tablet, Rfl: 1   loratadine (CLARITIN) 10 MG tablet, Take 1 tablet (10 mg total) by mouth daily., Disp: 90 tablet, Rfl: 1   metFORMIN (GLUCOPHAGE) 500 MG tablet, Take 1 tablet (500 mg total) by mouth 2 (two) times daily with a meal., Disp: 180 tablet, Rfl: 1   rosuvastatin (CRESTOR) 40 MG tablet, Take 1 tablet (40 mg total) by mouth daily. In place of Atorvastatin, Disp: 90 tablet, Rfl: 1   VASCEPA 1 g capsule, Take 2 capsules (2 g total) by mouth 2 (two) times daily., Disp: 360 capsule, Rfl: 1   Vitamin D, Ergocalciferol, (DRISDOL) 1.25 MG (50000 UNIT) CAPS capsule, Take 1 capsule (50,000 Units total) by mouth every 7 (seven) days., Disp: 12 capsule, Rfl: 1  No Known  Allergies  I personally reviewed active problem list, medication list, allergies, family history, social history, health maintenance with the patient/caregiver today.   ROS  Constitutional: Negative for fever or weight change.  Respiratory: Negative for cough and shortness of breath.   Cardiovascular: Negative for chest pain or palpitations.  Gastrointestinal: Negative for abdominal pain, no bowel changes.  Musculoskeletal: Negative for gait problem or joint swelling.  Skin: Negative for rash.  Neurological: Negative for dizziness or headache.  No other specific complaints in a complete review of systems (except as listed in HPI above).   Objective  Vitals:   12/09/21 0931  BP: 130/70  Pulse: 97  Resp: 16  SpO2: 98%  Weight: 251 lb (113.9 kg)  Height: _0  (1.778 m)    Body mass index is 36.01 kg/m.  Physical Exam  Constitutional: Patient appears well-developed and well-nourished. Obese  No distress.  HEENT: head atraumatic, normocephalic, pupils equal and reactive to light,, neck supple Cardiovascular: Normal rate, regular rhythm and normal heart sounds.  No murmur heard. No BLE edema. Pulmonary/Chest: Effort normal and breath sounds normal. No respiratory distress. Abdominal: Soft.  There is no tenderness. Skin: dermatitis under control  Psychiatric: Patient has a normal mood and affect. behavior is normal. Judgment and thought content normal.   Recent Results (from the past 2160 hour(s))  Lipid panel     Status: Abnormal   Collection Time: 09/17/21  8:17 AM  Result Value Ref Range   Cholesterol 226 (H) <200 mg/dL   HDL 34 (L) > OR = 40 mg/dL   Triglycerides 172 (H) <150 mg/dL   LDL Cholesterol (Calc) 160 (H) mg/dL (calc)    Comment: Reference range: <100 . Desirable range <100 mg/dL for primary prevention;   <70 mg/dL for patients with CHD or diabetic patients  with > or = 2 CHD risk factors. Marland Kitchen LDL-C is now calculated using the Martin-Hopkins  calculation,  which is a validated novel method providing  better accuracy than the Friedewald equation in the  estimation of LDL-C.  Cresenciano Genre et al. Annamaria Helling. 1610;960(45): 2061-2068  (http://education.[facility].com/faq/FAQ164)    Total CHOL/HDL Ratio 6.6 (H) <5.0 (calc)   Non-HDL Cholesterol (Calc) 192 (H) <130 mg/dL (calc)    Comment: For patients with diabetes plus 1 major ASCVD risk  factor, treating to a non-HDL-C goal of <100 mg/dL  (LDL-C of <70 mg/dL) is considered a therapeutic  option.   CBC with Differential/Platelet     Status: None   Collection Time: 09/17/21  8:17  AM  Result Value Ref Range   WBC 6.6 3.8 - 10.8 Thousand/uL   RBC 5.64 4.20 - 5.80 Million/uL   Hemoglobin 16.1 13.2 - 17.1 g/dL   HCT 48.5 38.5 - 50.0 %   MCV 86.0 80.0 - 100.0 fL   MCH 28.5 27.0 - 33.0 pg   MCHC 33.2 32.0 - 36.0 g/dL   RDW 14.5 11.0 - 15.0 %   Platelets 214 140 - 400 Thousand/uL   MPV 11.5 7.5 - 12.5 fL   Neutro Abs 3,465 1,500 - 7,800 cells/uL   Lymphs Abs 1,907 850 - 3,900 cells/uL   Absolute Monocytes 845 200 - 950 cells/uL   Eosinophils Absolute 310 15 - 500 cells/uL   Basophils Absolute 73 0 - 200 cells/uL   Neutrophils Relative % 52.5 %   Total Lymphocyte 28.9 %   Monocytes Relative 12.8 %   Eosinophils Relative 4.7 %   Basophils Relative 1.1 %  COMPLETE METABOLIC PANEL WITH GFR     Status: None   Collection Time: 09/17/21  8:17 AM  Result Value Ref Range   Glucose, Bld 97 65 - 99 mg/dL    Comment: .            Fasting reference interval .    BUN 16 7 - 25 mg/dL   Creat 1.20 0.70 - 1.30 mg/dL   eGFR 72 > OR = 60 mL/min/1.49m   BUN/Creatinine Ratio SEE NOTE: 6 - 22 (calc)    Comment:    Not Reported: BUN and Creatinine are within    reference range. .    Sodium 139 135 - 146 mmol/L   Potassium 4.0 3.5 - 5.3 mmol/L   Chloride 106 98 - 110 mmol/L   CO2 23 20 - 32 mmol/L   Calcium 9.6 8.6 - 10.3 mg/dL   Total Protein 8.0 6.1 - 8.1 g/dL   Albumin 4.4 3.6 - 5.1 g/dL    Globulin 3.6 1.9 - 3.7 g/dL (calc)   AG Ratio 1.2 1.0 - 2.5 (calc)   Total Bilirubin 0.7 0.2 - 1.2 mg/dL   Alkaline phosphatase (APISO) 64 35 - 144 U/L   AST 18 10 - 35 U/L   ALT 12 9 - 46 U/L  Hemoglobin A1c     Status: Abnormal   Collection Time: 09/17/21  8:17 AM  Result Value Ref Range   Hgb A1c MFr Bld 6.0 (H) <5.7 % of total Hgb    Comment: For someone without known diabetes, a hemoglobin  A1c value between 5.7% and 6.4% is consistent with prediabetes and should be confirmed with a  follow-up test. . For someone with known diabetes, a value <7% indicates that their diabetes is well controlled. A1c targets should be individualized based on duration of diabetes, age, comorbid conditions, and other considerations. . This assay result is consistent with an increased risk of diabetes. . Currently, no consensus exists regarding use of hemoglobin A1c for diagnosis of diabetes for children. .    Mean Plasma Glucose 126 mg/dL   eAG (mmol/L) 7.0 mmol/L  VITAMIN D 25 Hydroxy (Vit-D Deficiency, Fractures)     Status: Abnormal   Collection Time: 09/17/21  8:17 AM  Result Value Ref Range   Vit D, 25-Hydroxy 16 (L) 30 - 100 ng/mL    Comment: Vitamin D Status         25-OH Vitamin D: . Deficiency:                    <  20 ng/mL Insufficiency:             20 - 29 ng/mL Optimal:                 > or = 30 ng/mL . For 25-OH Vitamin D testing on patients on  D2-supplementation and patients for whom quantitation  of D2 and D3 fractions is required, the [facility](TM) 25-OH VIT D, (D2,D3), LC/MS/MS is recommended: order  code [id number] (patients >78yr). . See Note 1 . Note 1 . For additional information, please refer to  http://education.[facility].com/faq/FAQ199  (This link is being provided for informational/ educational purposes only.)   PSA     Status: None   Collection Time: 09/17/21  8:17 AM  Result Value Ref Range   PSA 0.86 < OR = 4.00 ng/mL    Comment: The total  PSA value from this assay system is  standardized against the WHO standard. The test  result will be approximately 20% lower when compared  to the equimolar-standardized total PSA (Beckman  Coulter). Comparison of serial PSA results should be  interpreted with this fact in mind. . This test was performed using the Siemens  chemiluminescent method. Values obtained from  different assay methods cannot be used interchangeably. PSA levels, regardless of value, should not be interpreted as absolute evidence of the presence or absence of disease.     PHQ2/9:    12/09/2021    9:30 AM 09/17/2021    7:40 AM 06/12/2021    8:04 AM 03/17/2021    8:20 AM 12/13/2020    7:59 AM  Depression screen PHQ 2/9  Decreased Interest 0 0 0 0 0  Down, Depressed, Hopeless 0 0 0 0 0  PHQ - 2 Score 0 0 0 0 0  Altered sleeping 0 0 0 0 0  Tired, decreased energy 0 0 0 0 0  Change in appetite 0 0 0 0 0  Feeling bad or failure about yourself  0 0 0 0 0  Trouble concentrating 0 0 0 0 0  Moving slowly or fidgety/restless 0 0 0 0 0  Suicidal thoughts  0 0 0 0  PHQ-9 Score 0 0 0 0 0  Difficult doing work/chores     Not difficult at all    phq 9 is negative   Fall Risk:    12/09/2021    9:30 AM 09/17/2021    7:40 AM 06/12/2021    8:03 AM 03/17/2021    8:20 AM 12/13/2020    7:59 AM  Fall Risk   Falls in the past year? 0 0 0 0 0  Number falls in past yr: 0 0 0 0 0  Injury with Fall? 0 0 0 0 0  Risk for fall due to : _0   Follow up _1       Functional Status Survey: Is the patient deaf or have difficulty hearing?: No Does the patient have difficulty seeing, even when wearing glasses/contacts?: No Does the patient have difficulty concentrating, remembering, or making decisions?: No Does the patient have  difficulty walking or climbing stairs?: No Does the patient have difficulty dressing or bathing?: No Does the patient have difficulty doing errands alone such as visiting a doctor's office or shopping?: No    Assessment & Plan  1. Need for immunization against influenza  - Flu Vaccine  QUAD 6+ mos PF IM (Fluarix Quad PF)  2. Dyslipidemia  - Hepatic function panel - Lipid Profile - rosuvastatin (CRESTOR) 40 MG tablet; Take 1 tablet (40 mg total) by mouth daily. In place of Atorvastatin  Dispense: 90 tablet; Refill: 1 - VASCEPA 1 g capsule; Take 2 capsules (2 g total) by mouth 2 (two) times daily.  Dispense: 360 capsule; Refill: 1  3. Microalbuminuria  - lisinopril (ZESTRIL) 10 MG tablet; Take 1 tablet (10 mg total) by mouth daily.  Dispense: 90 tablet; Refill: 1  4. Benign essential HTN  - lisinopril (ZESTRIL) 10 MG tablet; Take 1 tablet (10 mg total) by mouth daily.  Dispense: 90 tablet; Refill: 1  5. Allergic dermatitis  - hydrOXYzine (ATARAX) 25 MG tablet; Take 1 tablet (25 mg total) by mouth 2 (two) times daily as needed.  Dispense: 180 tablet; Refill: 1 - loratadine (CLARITIN) 10 MG tablet; Take 1 tablet (10 mg total) by mouth daily.  Dispense: 90 tablet; Refill: 1  6. Dysmetabolic syndrome  - metFORMIN (GLUCOPHAGE) 500 MG tablet; Take 1 tablet (500 mg total) by mouth 2 (two) times daily with a meal.  Dispense: 180 tablet; Refill: 1  7. Vitamin D deficiency  - Vitamin D, Ergocalciferol, (DRISDOL) 1.25 MG (50000 UNIT) CAPS capsule; Take 1 capsule (50,000 Units total) by mouth every 7 (seven) days.  Dispense: 12 capsule; Refill: 1

## 2021-12-09 ENCOUNTER — Encounter: Payer: Self-pay | Admitting: Family Medicine

## 2021-12-09 ENCOUNTER — Ambulatory Visit (INDEPENDENT_AMBULATORY_CARE_PROVIDER_SITE_OTHER): Payer: 59 | Admitting: Family Medicine

## 2021-12-09 VITALS — BP 122/70 | HR 97 | Resp 16 | Ht 70.0 in | Wt 251.0 lb

## 2021-12-09 DIAGNOSIS — Z23 Encounter for immunization: Secondary | ICD-10-CM

## 2021-12-09 DIAGNOSIS — E559 Vitamin D deficiency, unspecified: Secondary | ICD-10-CM

## 2021-12-09 DIAGNOSIS — L239 Allergic contact dermatitis, unspecified cause: Secondary | ICD-10-CM | POA: Diagnosis not present

## 2021-12-09 DIAGNOSIS — I10 Essential (primary) hypertension: Secondary | ICD-10-CM | POA: Diagnosis not present

## 2021-12-09 DIAGNOSIS — E785 Hyperlipidemia, unspecified: Secondary | ICD-10-CM

## 2021-12-09 DIAGNOSIS — R809 Proteinuria, unspecified: Secondary | ICD-10-CM | POA: Diagnosis not present

## 2021-12-09 DIAGNOSIS — E8881 Metabolic syndrome: Secondary | ICD-10-CM

## 2021-12-09 MED ORDER — LORATADINE 10 MG PO TABS: 10.0000 mg | ORAL_TABLET | Freq: Every day | ORAL | 1 refills | Status: DC

## 2021-12-09 MED ORDER — ROSUVASTATIN CALCIUM 40 MG PO TABS: 40.0000 mg | ORAL_TABLET | Freq: Every day | ORAL | 1 refills | Status: DC

## 2021-12-09 MED ORDER — METFORMIN HCL 500 MG PO TABS: 500.0000 mg | ORAL_TABLET | Freq: Two times a day (BID) | ORAL | 1 refills | Status: DC

## 2021-12-09 MED ORDER — VASCEPA 1 G PO CAPS: 2.0000 g | ORAL_CAPSULE | Freq: Two times a day (BID) | ORAL | 1 refills | Status: DC

## 2021-12-09 MED ORDER — VITAMIN D (ERGOCALCIFEROL) 1.25 MG (50000 UNIT) PO CAPS: 50000.0000 [IU] | ORAL_CAPSULE | ORAL | 1 refills | Status: DC

## 2021-12-09 MED ORDER — LISINOPRIL 10 MG PO TABS: 10.0000 mg | ORAL_TABLET | Freq: Every day | ORAL | 1 refills | Status: DC

## 2021-12-09 MED ORDER — HYDROXYZINE HCL 25 MG PO TABS: 25.0000 mg | ORAL_TABLET | Freq: Two times a day (BID) | ORAL | 1 refills | Status: DC | PRN

## 2021-12-19 ENCOUNTER — Ambulatory Visit: Payer: 59 | Admitting: Family Medicine

## 2022-06-08 NOTE — Progress Notes (Unsigned)
Name: Isaac Delacruz   MRN: [id number]    DOB: 11/09/1965   Date:06/09/2022       Progress Note  Subjective  Chief Complaint  Follow Up  HPI  Morbid Obesity:BMI above 35 with co-morbidities such as HTN, dyslipidemia and OSA. He is active at work, he has been cutting down on sodas - substituting with unsweet lemonade . He is only eating fast food twice a week , packs his lunch most of the time, eating more fruit and drinking more water. He lost 9 lbs in the past 6 months   HTN: he is compliant with medication, BP today is at goal, no side effects. No chest pain, palpitation or SOB    Dyslipidemia: Last LDL was up to 160 at the time he was on Rosuvastatin , vascepa  and zetia , he was approved for Repatha in Nov 2023 and tolerating medication well, he has a new insurance and we will likely need to do another PA, we will recheck labs on his next visit   Pre-diabetes: he denies polyphagia, polydipsia or polyuria. He is back on Metformin since July 2020.A1C has been stable, last level was 09/2021 at 6 % , he is eating healthier and we will recheck during his CPE   Dermatitsi Eczematous : He goes to Dermatologist in  [place], he states skin itches a lot a night and doing better with loratadine and hydroxizine , he states symptoms are stable. Currently not using any topical medication , advised to use moisturizes    OSA: he wears every time he goes to bed, he keeps it on for about 5-6 hours . Continue CPAP. No changes   Patient Active Problem List   Diagnosis Date Noted   Enlarged lymph node 09/22/2018   Seasonal allergic rhinitis 08/18/2016   Folliculitis 05/23/2015   Allergic dermatitis 05/23/2015   History of Bell's palsy 07/18/2014   Benign essential HTN 07/18/2014   Dyslipidemia 07/18/2014   LBP (low back pain) 07/18/2014   Vitamin D deficiency 07/18/2014   Dysmetabolic syndrome 07/18/2014   Microalbuminuria 07/18/2014   Morbid obesity ([facility]) 07/18/2014   Obstructive apnea 07/18/2014    Tobacco use 07/18/2014    Past Surgical History:  Procedure Laterality Date   COLONOSCOPY     COLONOSCOPY WITH PROPOFOL N/A 11/11/2016   Procedure: COLONOSCOPY WITH PROPOFOL;  Surgeon: Toney Reil, MD;  Location: [facility];  Service: Endoscopy;  Laterality: N/A;  sleep apnea    Family History  Problem Relation Age of Onset   Cancer Mother 56       Lung   Cancer Father        Prostate   Diabetes Father    Hypertension Brother    Cancer Maternal Aunt        Lung   Heart disease Brother     Social History   Tobacco Use   Smoking status: Some Days    Types: Cigars    Start date: 10/12/1997   Smokeless tobacco: Never   Tobacco comments:    Smokes 2 cigars daily  Substance Use Topics   Alcohol use: Yes    Alcohol/week: 0.0 standard drinks of alcohol    Comment: rarely     Current Outpatient Medications:    aspirin 81 MG tablet, Take by mouth., Disp: , Rfl:    Evolocumab (REPATHA) 140 MG/ML SOSY, Inject 1 each into the skin every 14 (fourteen) days., Disp: 2.1 mL, Rfl: 5   ezetimibe (ZETIA) 10 MG tablet,  TAKE ONE TABLET BY MOUTH ONCE DAILY WITH ROSUVASTATIN FOR CHOLESTEROL, Disp: 90 tablet, Rfl: 2   hydrOXYzine (ATARAX) 25 MG tablet, Take 1 tablet (25 mg total) by mouth 2 (two) times daily as needed., Disp: 180 tablet, Rfl: 1   lisinopril (ZESTRIL) 10 MG tablet, Take 1 tablet (10 mg total) by mouth daily., Disp: 90 tablet, Rfl: 1   loratadine (CLARITIN) 10 MG tablet, Take 1 tablet (10 mg total) by mouth daily., Disp: 90 tablet, Rfl: 1   metFORMIN (GLUCOPHAGE) 500 MG tablet, Take 1 tablet (500 mg total) by mouth 2 (two) times daily with a meal., Disp: 180 tablet, Rfl: 1   rosuvastatin (CRESTOR) 40 MG tablet, Take 1 tablet (40 mg total) by mouth daily. In place of Atorvastatin, Disp: 90 tablet, Rfl: 1   VASCEPA 1 g capsule, Take 2 capsules (2 g total) by mouth 2 (two) times daily., Disp: 360 capsule, Rfl: 1   Vitamin D, Ergocalciferol, (DRISDOL) 1.25 MG (50000  UNIT) CAPS capsule, Take 1 capsule (50,000 Units total) by mouth every 7 (seven) days., Disp: 12 capsule, Rfl: 1  No Known Allergies  I personally reviewed active problem list, medication list, allergies, family history, social history, health maintenance with the patient/caregiver today.   ROS  Constitutional: Negative for fever, positive  weight change.  Respiratory: Negative for cough and shortness of breath.   Cardiovascular: Negative for chest pain or palpitations.  Gastrointestinal: Negative for abdominal pain, no bowel changes.  Musculoskeletal: Negative for gait problem or joint swelling.  Skin:positive for chronic rash  Neurological: Negative for dizziness or headache.  No other specific complaints in a complete review of systems (except as listed in HPI above).   Objective  Vitals:   06/09/22 0753  BP: 126/78  Pulse: 96  Resp: 16  SpO2: 98%  Weight: 242 lb (109.8 kg)  Height: 5\' 10"  (1.778 m)    Body mass index is 34.72 kg/m.  Physical Exam  Constitutional: Patient appears well-developed and well-nourished. Obese  No distress.  HEENT: head atraumatic, normocephalic, pupils equal and reactive to light, neck supple Cardiovascular: Normal rate, regular rhythm and normal heart sounds.  No murmur heard. No BLE edema. Pulmonary/Chest: Effort normal and breath sounds normal. No respiratory distress. Abdominal: Soft.  There is no tenderness. Psychiatric: Patient has a normal mood and affect. behavior is normal. Judgment and thought content normal.    PHQ2/9:    06/09/2022    7:53 AM 12/09/2021    9:30 AM 09/17/2021    7:40 AM 06/12/2021    8:04 AM 03/17/2021    8:20 AM  Depression screen PHQ 2/9  Decreased Interest 0 0 0 0 0  Down, Depressed, Hopeless 0 0 0 0 0  PHQ - 2 Score 0 0 0 0 0  Altered sleeping 0 0 0 0 0  Tired, decreased energy 0 0 0 0 0  Change in appetite 0 0 0 0 0  Feeling bad or failure about yourself  0 0 0 0 0  Trouble concentrating 0 0 0 0 0   Moving slowly or fidgety/restless 0 0 0 0 0  Suicidal thoughts 0  0 0 0  PHQ-9 Score 0 0 0 0 0    phq 9 is negative   Fall Risk:    06/09/2022    7:53 AM 12/09/2021    9:30 AM 09/17/2021    7:40 AM 06/12/2021    8:03 AM 03/17/2021    8:20 AM  Fall Risk  Falls in the past year? 0 0 0 0 0  Number falls in past yr: 0 0 0 0 0  Injury with Fall? 0 0 0 0 0  Risk for fall due to : No Fall Risks No Fall Risks No Fall Risks No Fall Risks No Fall Risks  Follow up Falls prevention discussed Falls prevention discussed Falls prevention discussed Falls prevention discussed Falls prevention discussed      Functional Status Survey: Is the patient deaf or have difficulty hearing?: No Does the patient have difficulty seeing, even when wearing glasses/contacts?: No Does the patient have difficulty concentrating, remembering, or making decisions?: No Does the patient have difficulty walking or climbing stairs?: No Does the patient have difficulty dressing or bathing?: No Does the patient have difficulty doing errands alone such as visiting a doctor's office or shopping?: No    Assessment & Plan  1. Benign essential HTN  - lisinopril (ZESTRIL) 10 MG tablet; Take 1 tablet (10 mg total) by mouth daily.  Dispense: 90 tablet; Refill: 1  2. Allergic dermatitis  - loratadine (CLARITIN) 10 MG tablet; Take 1 tablet (10 mg total) by mouth daily.  Dispense: 90 tablet; Refill: 1  3. Familial hyperlipidemia  - Evolocumab (REPATHA) 140 MG/ML SOSY; Inject 1 each into the skin every 14 (fourteen) days.  Dispense: 2.1 mL; Refill: 5 - ezetimibe (ZETIA) 10 MG tablet; TAKE ONE TABLET BY MOUTH ONCE DAILY WITH ROSUVASTATIN FOR CHOLESTEROL  Dispense: 90 tablet; Refill: 2 - rosuvastatin (CRESTOR) 40 MG tablet; Take 1 tablet (40 mg total) by mouth daily. In place of Atorvastatin  Dispense: 90 tablet; Refill: 1 - VASCEPA 1 g capsule; Take 2 capsules (2 g total) by mouth 2 (two) times daily.  Dispense: 360 capsule;  Refill: 1  4. Dysmetabolic syndrome  - metFORMIN (GLUCOPHAGE) 500 MG tablet; Take 1 tablet (500 mg total) by mouth 2 (two) times daily with a meal.  Dispense: 180 tablet; Refill: 1  5. Vitamin D deficiency  - Vitamin D, Ergocalciferol, (DRISDOL) 1.25 MG (50000 UNIT) CAPS capsule; Take 1 capsule (50,000 Units total) by mouth every 7 (seven) days.  Dispense: 12 capsule; Refill: 1  6. Microalbuminuria  - lisinopril (ZESTRIL) 10 MG tablet; Take 1 tablet (10 mg total) by mouth daily.  Dispense: 90 tablet; Refill: 1  7. OSA on CPAP   8. Shift work sleep disorder   9. Obesity (BMI 30.0-34.9)  Discussed with the patient the risk posed by an increased BMI. Discussed importance of portion control, calorie counting and at least 150 minutes of physical activity weekly. Avoid sweet beverages and drink more water. Eat at least 6 servings of fruit and vegetables daily

## 2022-06-09 ENCOUNTER — Encounter: Payer: Self-pay | Admitting: Family Medicine

## 2022-06-09 ENCOUNTER — Ambulatory Visit: Payer: 59 | Admitting: Family Medicine

## 2022-06-09 VITALS — BP 126/78 | HR 96 | Resp 16 | Ht 70.0 in | Wt 242.0 lb

## 2022-06-09 DIAGNOSIS — G4726 Circadian rhythm sleep disorder, shift work type: Secondary | ICD-10-CM

## 2022-06-09 DIAGNOSIS — L239 Allergic contact dermatitis, unspecified cause: Secondary | ICD-10-CM | POA: Diagnosis not present

## 2022-06-09 DIAGNOSIS — E8881 Metabolic syndrome: Secondary | ICD-10-CM | POA: Diagnosis not present

## 2022-06-09 DIAGNOSIS — G4733 Obstructive sleep apnea (adult) (pediatric): Secondary | ICD-10-CM

## 2022-06-09 DIAGNOSIS — E7849 Other hyperlipidemia: Secondary | ICD-10-CM | POA: Insufficient documentation

## 2022-06-09 DIAGNOSIS — I10 Essential (primary) hypertension: Secondary | ICD-10-CM

## 2022-06-09 DIAGNOSIS — R809 Proteinuria, unspecified: Secondary | ICD-10-CM

## 2022-06-09 DIAGNOSIS — E669 Obesity, unspecified: Secondary | ICD-10-CM

## 2022-06-09 DIAGNOSIS — E559 Vitamin D deficiency, unspecified: Secondary | ICD-10-CM

## 2022-06-09 MED ORDER — EZETIMIBE 10 MG PO TABS: ORAL_TABLET | ORAL | 2 refills | Status: DC

## 2022-06-09 MED ORDER — METFORMIN HCL 500 MG PO TABS: 500.0000 mg | ORAL_TABLET | Freq: Two times a day (BID) | ORAL | 1 refills | Status: DC

## 2022-06-09 MED ORDER — REPATHA 140 MG/ML ~~LOC~~ SOSY: 1.0000 | PREFILLED_SYRINGE | SUBCUTANEOUS | 5 refills | Status: DC

## 2022-06-09 MED ORDER — LISINOPRIL 10 MG PO TABS: 10.0000 mg | ORAL_TABLET | Freq: Every day | ORAL | 1 refills | Status: DC

## 2022-06-09 MED ORDER — ROSUVASTATIN CALCIUM 40 MG PO TABS: 40.0000 mg | ORAL_TABLET | Freq: Every day | ORAL | 1 refills | Status: DC

## 2022-06-09 MED ORDER — VASCEPA 1 G PO CAPS: 2.0000 g | ORAL_CAPSULE | Freq: Two times a day (BID) | ORAL | 1 refills | Status: DC

## 2022-06-09 MED ORDER — LORATADINE 10 MG PO TABS: 10.0000 mg | ORAL_TABLET | Freq: Every day | ORAL | 1 refills | Status: DC

## 2022-06-09 MED ORDER — VITAMIN D (ERGOCALCIFEROL) 1.25 MG (50000 UNIT) PO CAPS
50000.0000 [IU] | ORAL_CAPSULE | ORAL | 1 refills | Status: DC
Start: 2022-06-09 — End: 2023-09-29

## 2022-07-09 LAB — HM DIABETES EYE EXAM

## 2022-09-22 NOTE — Progress Notes (Unsigned)
Name: Isaac Delacruz   MRN: [id number]    DOB: 1966/01/05   Date:09/23/2022       Progress Note  Subjective  Chief Complaint  Annual Exam  HPI  Patient presents for annual CPE.  IPSS Questionnaire (AUA-7): Over the past month.   1)  How often have you had a sensation of not emptying your bladder completely after you finish urinating?  0 - Not at all  2)  How often have you had to urinate again less than two hours after you finished urinating? 0 - Not at all  3)  How often have you found you stopped and started again several times when you urinated?  0 - Not at all  4) How difficult have you found it to postpone urination?  0 - Not at all  5) How often have you had a weak urinary stream?  0 - Not at all  6) How often have you had to push or strain to begin urination?  0 - Not at all  7) How many times did you most typically get up to urinate from the time you went to bed until the time you got up in the morning?  0 - None  Total score:  0-7 mildly symptomatic   8-19 moderately symptomatic   20-35 severely symptomatic     Diet: cutting down on sodas, used to drink it daily but now just one can a few times a week  Exercise: walks a lot at work but discussed other types of physical activity  Last Dental Exam: up to date Last Eye Exam: up to date   Depression: phq 9 is negative    09/23/2022    7:39 AM 06/09/2022    7:53 AM 12/09/2021    9:30 AM 09/17/2021    7:40 AM 06/12/2021    8:04 AM  Depression screen PHQ 2/9  Decreased Interest 0 0 0 0 0  Down, Depressed, Hopeless 0 0 0 0 0  PHQ - 2 Score 0 0 0 0 0  Altered sleeping 0 0 0 0 0  Tired, decreased energy 0 0 0 0 0  Change in appetite 0 0 0 0 0  Feeling bad or failure about yourself  0 0 0 0 0  Trouble concentrating 0 0 0 0 0  Moving slowly or fidgety/restless 0 0 0 0 0  Suicidal thoughts 0 0  0 0  PHQ-9 Score 0 0 0 0 0    Hypertension:  BP Readings from Last 3 Encounters:  09/23/22 132/70  06/09/22 126/78  12/09/21  122/70    Obesity: Wt Readings from Last 3 Encounters:  09/23/22 247 lb (112 kg)  06/09/22 242 lb (109.8 kg)  12/09/21 251 lb (113.9 kg)   BMI Readings from Last 3 Encounters:  09/23/22 36.48 kg/m  06/09/22 34.72 kg/m  12/09/21 36.01 kg/m     Lipids:  Lab Results  Component Value Date   CHOL 226 (H) 09/17/2021   CHOL 200 (H) 06/12/2021   CHOL 210 (H) 09/10/2020   Lab Results  Component Value Date   HDL 34 (L) 09/17/2021   HDL 32 (L) 06/12/2021   HDL 38 (L) 09/10/2020   Lab Results  Component Value Date   LDLCALC 160 (H) 09/17/2021   LDLCALC 137 (H) 06/12/2021   LDLCALC 148 (H) 09/10/2020   Lab Results  Component Value Date   TRIG 172 (H) 09/17/2021   TRIG 172 (H) 06/12/2021   TRIG 122 09/10/2020  Lab Results  Component Value Date   CHOLHDL 6.6 (H) 09/17/2021   CHOLHDL 6.3 (H) 06/12/2021   CHOLHDL 5.5 (H) 09/10/2020   No results found for: "LDLDIRECT" Glucose:  Glucose, Bld  Date Value Ref Range Status  09/17/2021 97 65 - 99 mg/dL Final    Comment:    .            Fasting reference interval .   09/10/2020 88 65 - 99 mg/dL Final    Comment:    .            Fasting reference interval .   03/08/2020 96 65 - 99 mg/dL Final    Comment:    .            Fasting reference interval .    Glucose-Capillary  Date Value Ref Range Status  12/07/2018 83 70 - 99 mg/dL Final  60/73/7106 94 65 - 99 mg/dL Final    Flowsheet Row Office Visit from 09/10/2020 in [facility]  AUDIT-C Score 1      Domestic Partner STD testing and prevention (HIV/chl/gon/syphilis): 09/09/16 Sexual history: one sexual partner  Hep C Screening: 09/09/16 Skin cancer: Discussed monitoring for atypical lesions Colorectal cancer: 11/11/16 Prostate cancer:   Lab Results  Component Value Date   PSA 0.86 09/17/2021   PSA 0.80 09/10/2020   PSA 0.6 09/06/2019     Lung cancer:  Low Dose CT Chest recommended if Age 31-80 years, 30 pack-year currently  smoking OR have quit w/in 15years. Patient is not a candidate AAA: The USPSTF recommends one-time screening with ultrasonography in men ages 82 to 79 years who have ever smoked. Not a candidate  ECG:  11/22/15  Vaccines:   Tdap: up to date Shingrix: up to date Pneumonia: up to date Flu: up to date, he will receive next dose in Nov  COVID-19: up to date  Advanced Care Planning: A voluntary discussion about advance care planning including the explanation and discussion of advance directives.  Discussed health care proxy and Living will, and the patient was able to identify a health care proxy as wife .  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   Enlarged lymph node 09/22/2018   Seasonal allergic rhinitis 08/18/2016   Folliculitis 05/23/2015   Allergic dermatitis 05/23/2015   History of Bell's palsy 07/18/2014   Benign essential HTN 07/18/2014   Dyslipidemia 07/18/2014   LBP (low back pain) 07/18/2014   Vitamin D deficiency 07/18/2014   Dysmetabolic syndrome 07/18/2014   Microalbuminuria 07/18/2014   Morbid obesity (HCC) 07/18/2014   OSA on CPAP 07/18/2014   Tobacco use 07/18/2014    Past Surgical History:  Procedure Laterality Date   COLONOSCOPY     COLONOSCOPY WITH PROPOFOL N/A 11/11/2016   Procedure: COLONOSCOPY WITH PROPOFOL;  Surgeon: Toney Reil, MD;  Location: [facility];  Service: Endoscopy;  Laterality: N/A;  sleep apnea    Family History  Problem Relation Age of Onset   Cancer Mother 36       Lung   Cancer Father        Prostate   Diabetes Father    Hypertension Brother    Cancer Maternal Aunt        Lung   Heart disease Brother     Social  History   Socioeconomic History   Marital status: Media planner    Spouse name: Dorris    Number of children: 7   Years of education:  Not on file   Highest education level: 11th grade  Occupational History   Occupation: Scientist, physiological at the dye [employer]   Tobacco Use   Smoking status: Some Days    Types: Cigars    Start date: 10/12/1997   Smokeless tobacco: Never   Tobacco comments:    Smokes 2 cigars daily  Vaping Use   Vaping status: Never Used  Substance and Sexual Activity   Alcohol use: Yes    Alcohol/week: 0.0 standard drinks of alcohol    Comment: rarely   Drug use: No   Sexual activity: Yes    Partners: Female  Other Topics Concern   Not on file  Social History Narrative   Works full time at [employer] in the dye room    Lives with wife  and they have 3 children together, just one child at home    Social Determinants of Health   Financial Resource Strain: Low Risk  (09/23/2022)   Overall Financial Resource Strain (CARDIA)    Difficulty of Paying Living Expenses: Not hard at all  Food Insecurity: No Food Insecurity (09/23/2022)   Hunger Vital Sign    Worried About Running Out of Food in the Last Year: Never true    Ran Out of Food in the Last Year: Never true  Transportation Needs: No Transportation Needs (09/23/2022)   PRAPARE - Administrator, Civil Service (Medical): No    Lack of Transportation (Non-Medical): No  Physical Activity: Inactive (09/23/2022)   Exercise Vital Sign    Days of Exercise per Week: 0 days    Minutes of Exercise per Session: 0 min  Stress: No Stress Concern Present (09/23/2022)   [employer] of Occupational Health - Occupational Stress Questionnaire    Feeling of Stress : Not at all  Social Connections: Moderately Isolated (09/23/2022)   Social Connection and Isolation Panel [NHANES]    Frequency of Communication with Friends and Family: Once a week    Frequency of Social Gatherings with Friends and Family: Twice a week    Attends Religious Services: More than 4 times per year    Active Member of [employer] or Organizations: No    Attends Banker  Meetings: Never    Marital Status: Separated  Intimate Partner Violence: Not At Risk (09/23/2022)   Humiliation, Afraid, Rape, and Kick questionnaire    Fear of Current or Ex-Partner: No    Emotionally Abused: No    Physically Abused: No    Sexually Abused: No     Current Outpatient Medications:    aspirin 81 MG tablet, Take by mouth., Disp: , Rfl:    Evolocumab (REPATHA) 140 MG/ML SOSY, Inject 1 each into the skin every 14 (fourteen) days., Disp: 2.1 mL, Rfl: 5   ezetimibe (ZETIA) 10 MG tablet, TAKE ONE TABLET BY MOUTH ONCE DAILY WITH ROSUVASTATIN FOR CHOLESTEROL, Disp: 90 tablet, Rfl: 2   hydrOXYzine (ATARAX) 25 MG tablet, Take 1 tablet (25 mg total) by mouth 2 (two) times daily as needed., Disp: 180 tablet, Rfl: 1   lisinopril (ZESTRIL) 10 MG tablet, Take 1 tablet (10 mg total) by mouth daily., Disp: 90 tablet, Rfl: 1   loratadine (CLARITIN) 10 MG tablet, Take 1 tablet (10 mg total) by mouth daily., Disp: 90 tablet, Rfl: 1   metFORMIN (  GLUCOPHAGE) 500 MG tablet, Take 1 tablet (500 mg total) by mouth 2 (two) times daily with a meal., Disp: 180 tablet, Rfl: 1   rosuvastatin (CRESTOR) 40 MG tablet, Take 1 tablet (40 mg total) by mouth daily. In place of Atorvastatin, Disp: 90 tablet, Rfl: 1   VASCEPA 1 g capsule, Take 2 capsules (2 g total) by mouth 2 (two) times daily., Disp: 360 capsule, Rfl: 1   Vitamin D, Ergocalciferol, (DRISDOL) 1.25 MG (50000 UNIT) CAPS capsule, Take 1 capsule (50,000 Units total) by mouth every 7 (seven) days., Disp: 12 capsule, Rfl: 1  No Known Allergies   ROS  Constitutional: Negative for fever or significant weight change.  Respiratory: Negative for cough and shortness of breath.   Cardiovascular: Negative for chest pain or palpitations.  Gastrointestinal: Negative for abdominal pain, no bowel changes.  Musculoskeletal: Negative for gait problem or joint swelling.  Skin: Negative for rash.  Neurological: Negative for dizziness or headache.  No other  specific complaints in a complete review of systems (except as listed in HPI above).    Objective  Vitals:   09/23/22 0740  BP: 132/70  Pulse: 83  Resp: 16  SpO2: 98%  Weight: 247 lb (112 kg)  Height: 5\' 9"  (1.753 m)    Body mass index is 36.48 kg/m.  Physical Exam  Constitutional: Patient appears well-developed and well-nourished. No distress.  HENT: Head: Normocephalic and atraumatic. Ears: B TMs ok, no erythema or effusion; Nose: Nose normal. Mouth/Throat: Oropharynx is clear and moist. No oropharyngeal exudate.  Eyes: Conjunctivae and EOM are normal. Pupils are equal, round, and reactive to light. No scleral icterus.  Neck: Normal range of motion. Neck supple. No JVD present. No thyromegaly present.  Cardiovascular: Normal rate, regular rhythm and normal heart sounds.  No murmur heard. No BLE edema. Pulmonary/Chest: Effort normal and breath sounds normal. No respiratory distress. Abdominal: Soft. Bowel sounds are normal, no distension. There is no tenderness. no masses MALE GENITALIA: Normal descended testes bilaterally, no masses palpated, no hernias, no lesions, no discharge RECTAL: Prostate normal size and consistency, no rectal masses or hemorrhoids Musculoskeletal: Normal range of motion, no joint effusions. No gross deformities Neurological: he is alert and oriented to person, place, and time. No cranial nerve deficit. Coordination, balance, strength, speech and gait are normal.  Skin: Skin is warm and dry. he has scars from scratching legs and arms when he was not being treated for eczema  Psychiatric: Patient has a normal mood and affect. behavior is normal. Judgment and thought content normal.   Recent Results (from the past 2160 hour(s))  HM DIABETES EYE EXAM     Status: None   Collection Time: 07/09/22 12:00 AM  Result Value Ref Range   HM Diabetic Eye Exam No Retinopathy No Retinopathy     Fall Risk:    09/23/2022    7:39 AM 06/09/2022    7:53 AM 12/09/2021     9:30 AM 09/17/2021    7:40 AM 06/12/2021    8:03 AM  Fall Risk   Falls in the past year? 0 0 0 0 0  Number falls in past yr: 0 0 0 0 0  Injury with Fall? 0 0 0 0 0  Risk for fall due to : No Fall Risks No Fall Risks No Fall Risks No Fall Risks No Fall Risks  Follow up Falls prevention discussed Falls prevention discussed Falls prevention discussed Falls prevention discussed Falls prevention discussed     Functional Status  Survey: Is the patient deaf or have difficulty hearing?: No Does the patient have difficulty seeing, even when wearing glasses/contacts?: No Does the patient have difficulty concentrating, remembering, or making decisions?: No Does the patient have difficulty walking or climbing stairs?: No Does the patient have difficulty dressing or bathing?: No Does the patient have difficulty doing errands alone such as visiting a doctor's office or shopping?: No    Assessment & Plan  1. Well adult exam  - PSA - Lipid panel - CBC with Differential/Platelet - COMPLETE METABOLIC PANEL WITH GFR - Hemoglobin A1c - VITAMIN D 25 Hydroxy (Vit-D Deficiency, Fractures)  2. Dysmetabolic syndrome  - Hemoglobin A1c  3. Benign essential HTN  - CBC with Differential/Platelet - COMPLETE METABOLIC PANEL WITH GFR  4. Vitamin D deficiency  - VITAMIN D 25 Hydroxy (Vit-D Deficiency, Fractures)  5. Familial hyperlipidemia  - Lipid panel  6. Family history of prostate cancer  - PSA     -Prostate cancer screening and PSA options (with potential risks and benefits of testing vs not testing) were discussed along with recent recs/guidelines. -USPSTF grade A and B recommendations reviewed with patient; age-appropriate recommendations, preventive care, screening tests, etc discussed and encouraged; healthy living encouraged; see AVS for patient education given to patient -Discussed importance of 150 minutes of physical activity weekly, eat two servings of fish weekly, eat one  serving of tree nuts ( cashews, pistachios, pecans, almonds.Marland Kitchen) every other day, eat 6 servings of fruit/vegetables daily and drink plenty of water and avoid sweet beverages.  -Reviewed Health Maintenance: yes

## 2022-09-23 ENCOUNTER — Encounter: Payer: Self-pay | Admitting: Family Medicine

## 2022-09-23 ENCOUNTER — Ambulatory Visit (INDEPENDENT_AMBULATORY_CARE_PROVIDER_SITE_OTHER): Payer: No Typology Code available for payment source | Admitting: Family Medicine

## 2022-09-23 VITALS — BP 132/70 | HR 83 | Resp 16 | Ht 69.0 in | Wt 247.0 lb

## 2022-09-23 DIAGNOSIS — Z8042 Family history of malignant neoplasm of prostate: Secondary | ICD-10-CM

## 2022-09-23 DIAGNOSIS — Z0000 Encounter for general adult medical examination without abnormal findings: Secondary | ICD-10-CM | POA: Diagnosis not present

## 2022-09-23 DIAGNOSIS — E8881 Metabolic syndrome: Secondary | ICD-10-CM

## 2022-09-23 DIAGNOSIS — I10 Essential (primary) hypertension: Secondary | ICD-10-CM

## 2022-09-23 DIAGNOSIS — E559 Vitamin D deficiency, unspecified: Secondary | ICD-10-CM

## 2022-09-23 DIAGNOSIS — E7849 Other hyperlipidemia: Secondary | ICD-10-CM

## 2022-09-24 LAB — COMPLETE METABOLIC PANEL WITH GFR
AG Ratio: 1.2 (calc) (ref 1.0–2.5)
ALT: 11 U/L (ref 9–46)
AST: 20 U/L (ref 10–35)
Albumin: 3.9 g/dL (ref 3.6–5.1)
Alkaline phosphatase (APISO): 60 U/L (ref 35–144)
BUN: 13 mg/dL (ref 7–25)
CO2: 25 mmol/L (ref 20–32)
Calcium: 9.2 mg/dL (ref 8.6–10.3)
Chloride: 105 mmol/L (ref 98–110)
Creat: 1.09 mg/dL (ref 0.70–1.30)
Globulin: 3.3 g/dL (calc) (ref 1.9–3.7)
Glucose, Bld: 89 mg/dL (ref 65–99)
Potassium: 4.1 mmol/L (ref 3.5–5.3)
Sodium: 139 mmol/L (ref 135–146)
Total Bilirubin: 0.6 mg/dL (ref 0.2–1.2)
Total Protein: 7.2 g/dL (ref 6.1–8.1)
eGFR: 80 mL/min/{1.73_m2} (ref >=60)

## 2022-09-24 LAB — CBC WITH DIFFERENTIAL/PLATELET
Absolute Monocytes: 832 {cells}/uL (ref 200–950)
Basophils Absolute: 52 {cells}/uL (ref 0–200)
Basophils Relative: 0.8 %
Eosinophils Absolute: 286 cells/uL (ref 15–500)
Eosinophils Relative: 4.4 %
HCT: 44.3 % (ref 38.5–50.0)
Hemoglobin: 14.8 g/dL (ref 13.2–17.1)
Lymphs Abs: 1612 cells/uL (ref 850–3900)
MCH: 28.8 pg (ref 27.0–33.0)
MCHC: 33.4 g/dL (ref 32.0–36.0)
MCV: 86.4 fL (ref 80.0–100.0)
MPV: 11.5 fL (ref 7.5–12.5)
Monocytes Relative: 12.8 %
Neutro Abs: 3718 {cells}/uL (ref 1500–7800)
Neutrophils Relative %: 57.2 %
Platelets: 209 10*3/uL (ref 140–400)
RBC: 5.13 10*6/uL (ref 4.20–5.80)
RDW: 13.9 % (ref 11.0–15.0)
Total Lymphocyte: 24.8 %
WBC: 6.5 10*3/uL (ref 3.8–10.8)

## 2022-09-24 LAB — HEMOGLOBIN A1C
Hgb A1c MFr Bld: 6.2 %{Hb} — ABNORMAL HIGH (ref <5.7)
Mean Plasma Glucose: 131 mg/dL
eAG (mmol/L): 7.3 mmol/L

## 2022-09-24 LAB — LIPID PANEL
Cholesterol: 192 mg/dL (ref <200)
HDL: 34 mg/dL — ABNORMAL LOW (ref >=40)
LDL Cholesterol (Calc): 134 mg/dL — ABNORMAL HIGH
Non-HDL Cholesterol (Calc): 158 mg/dL — ABNORMAL HIGH (ref <130)
Total CHOL/HDL Ratio: 5.6 (calc) — ABNORMAL HIGH (ref <5.0)
Triglycerides: 129 mg/dL (ref <150)

## 2022-09-24 LAB — VITAMIN D 25 HYDROXY (VIT D DEFICIENCY, FRACTURES): Vit D, 25-Hydroxy: 19 ng/mL — ABNORMAL LOW (ref 30–100)

## 2022-09-24 LAB — PSA: PSA: 1.31 ng/mL (ref <=4.00)

## 2022-09-28 ENCOUNTER — Telehealth: Payer: Self-pay

## 2022-09-28 NOTE — Telephone Encounter (Signed)
Pt given lab results per notes of Dr. Carlynn Purl on 09/28/22. Pt verbalized understanding.

## 2022-12-09 NOTE — Progress Notes (Unsigned)
Name: Isaac Delacruz   MRN: [id number]    DOB: Dec 18, 1965   Date:12/10/2022       Progress Note  Subjective  Chief Complaint  Follow Up  HPI  Morbid Obesity:BMI above 35 with co-morbidities such as HTN, dyslipidemia and OSA. He is active at work, he has been cutting down on sodas - substituting with unsweet lemonade . He is still eating fast food a couple of times a week. 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 SBP at 132 and we will adjust dose of lisinopril to 20 mg daily. No chest pain, palpitation or SOB    Familial hyperlipidemia : Rosuvastatin , vascepa  and zetia , he was approved for Repatha in Nov 2023 and tolerating medication well, last LDL was down to 134 but still high based on all the medications he takes. He denies family history of heart attacks or strokes  . He states he will pack healthier snacks for work   Pre-diabetes: he denies polyphagia, polydipsia or polyuria. He is back on Metformin since July 2020.A1C has been stable , last level was 6.2 %   Dermatitis Eczematous : He goes to Dermatologist in  [place], he states skin itches a lot a night and doing better with loratadine and hydroxizine , he states symptoms are stable. Reminded him to use unscented lotion   OSA: he wears every time he goes to bed, he keeps it on for about 5-6 hours Continue CPAP.   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   Enlarged lymph node 09/22/2018   Seasonal allergic rhinitis 08/18/2016   Folliculitis 05/23/2015   Allergic dermatitis 05/23/2015   History of Bell's palsy 07/18/2014   Benign essential HTN 07/18/2014   Dyslipidemia 07/18/2014   Low back pain 07/18/2014   Vitamin D deficiency 07/18/2014   Dysmetabolic syndrome 07/18/2014   Microalbuminuria 07/18/2014   Morbid obesity (HCC)  07/18/2014   OSA on CPAP 07/18/2014   Tobacco use 07/18/2014    Past Surgical History:  Procedure Laterality Date   COLONOSCOPY     COLONOSCOPY WITH PROPOFOL N/A 11/11/2016   Procedure: COLONOSCOPY WITH PROPOFOL;  Surgeon: Toney Reil, MD;  Location: [facility];  Service: Endoscopy;  Laterality: N/A;  sleep apnea    Family History  Problem Relation Age of Onset   Cancer Mother 89       Lung   Cancer Father        Prostate   Diabetes Father    Hypertension Brother    Cancer Maternal Aunt        Lung   Heart disease Brother     Social History   Tobacco Use   Smoking status: Some Days    Types: Cigars    Start date: 10/12/1997   Smokeless tobacco: Never   Tobacco comments:    Smokes 2 cigars daily  Substance Use Topics   Alcohol use: Yes    Alcohol/week: 0.0 standard drinks of alcohol    Comment: rarely     Current Outpatient Medications:    aspirin 81 MG tablet, Take by mouth., Disp: , Rfl:    Evolocumab (REPATHA) 140 MG/ML SOSY, Inject 1 each into the skin every 14 (fourteen) days., Disp: 2.1 mL, Rfl: 5   ezetimibe (ZETIA) 10 MG tablet, TAKE  ONE TABLET BY MOUTH ONCE DAILY WITH ROSUVASTATIN FOR CHOLESTEROL, Disp: 90 tablet, Rfl: 2   hydrOXYzine (ATARAX) 25 MG tablet, Take 1 tablet (25 mg total) by mouth 2 (two) times daily as needed., Disp: 180 tablet, Rfl: 1   lisinopril (ZESTRIL) 10 MG tablet, Take 1 tablet (10 mg total) by mouth daily., Disp: 90 tablet, Rfl: 1   loratadine (CLARITIN) 10 MG tablet, Take 1 tablet (10 mg total) by mouth daily., Disp: 90 tablet, Rfl: 1   metFORMIN (GLUCOPHAGE) 500 MG tablet, Take 1 tablet (500 mg total) by mouth 2 (two) times daily with a meal., Disp: 180 tablet, Rfl: 1   rosuvastatin (CRESTOR) 40 MG tablet, Take 1 tablet (40 mg total) by mouth daily. In place of Atorvastatin, Disp: 90 tablet, Rfl: 1   VASCEPA 1 g capsule, Take 2 capsules (2 g total) by mouth 2 (two) times daily., Disp: 360 capsule, Rfl: 1   Vitamin D,  Ergocalciferol, (DRISDOL) 1.25 MG (50000 UNIT) CAPS capsule, Take 1 capsule (50,000 Units total) by mouth every 7 (seven) days., Disp: 12 capsule, Rfl: 1  No Known Allergies  I personally reviewed active problem list, medication list, allergies, family history, social history, health maintenance with the patient/caregiver today.   ROS  Constitutional: Negative for fever or weight change.  Respiratory: Negative for cough and shortness of breath.   Cardiovascular: Negative for chest pain or palpitations.  Gastrointestinal: Negative for abdominal pain, no bowel changes.  Musculoskeletal: Negative for gait problem or joint swelling.  Skin: Negative for rash.  Neurological: Negative for dizziness or headache.  No other specific complaints in a complete review of systems (except as listed in HPI above).   Objective  Vitals:   12/10/22 0806  BP: 132/70  Pulse: 88  Resp: 16  SpO2: 98%  Weight: 248 lb (112.5 kg)  Height: 5\' 10"  (1.778 m)    Body mass index is 35.58 kg/m.  Physical Exam  Constitutional: Patient appears well-developed and well-nourished. Obese  No distress.  HEENT: head atraumatic, normocephalic, pupils equal and reactive to light, neck supple Cardiovascular: Normal rate, regular rhythm and normal heart sounds.  No murmur heard. No BLE edema. Pulmonary/Chest: Effort normal and breath sounds normal. No respiratory distress. Abdominal: Soft.  There is no tenderness. Psychiatric: Patient has a normal mood and affect. behavior is normal. Judgment and thought content normal.    PHQ2/9:    12/10/2022    8:07 AM 09/23/2022    7:39 AM 06/09/2022    7:53 AM 12/09/2021    9:30 AM 09/17/2021    7:40 AM  Depression screen PHQ 2/9  Decreased Interest 0 0 0 0 0  Down, Depressed, Hopeless 0 0 0 0 0  PHQ - 2 Score 0 0 0 0 0  Altered sleeping 0 0 0 0 0  Tired, decreased energy 0 0 0 0 0  Change in appetite 0 0 0 0 0  Feeling bad or failure about yourself  0 0 0 0 0  Trouble  concentrating 0 0 0 0 0  Moving slowly or fidgety/restless 0 0 0 0 0  Suicidal thoughts 0 0 0  0  PHQ-9 Score 0 0 0 0 0    phq 9 is negative   Fall Risk:    12/10/2022    8:07 AM 09/23/2022    7:39 AM 06/09/2022    7:53 AM 12/09/2021    9:30 AM 09/17/2021    7:40 AM  Fall Risk   Falls in  the past year? 0 0 0 0 0  Number falls in past yr: 0 0 0 0 0  Injury with Fall? 0 0 0 0 0  Risk for fall due to : No Fall Risks No Fall Risks No Fall Risks No Fall Risks No Fall Risks  Follow up  Falls prevention discussed Falls prevention discussed Falls prevention discussed Falls prevention discussed      Functional Status Survey: Is the patient deaf or have difficulty hearing?: No Does the patient have difficulty seeing, even when wearing glasses/contacts?: No Does the patient have difficulty concentrating, remembering, or making decisions?: No Does the patient have difficulty walking or climbing stairs?: No Does the patient have difficulty dressing or bathing?: No Does the patient have difficulty doing errands alone such as visiting a doctor's office or shopping?: No    Assessment & Plan  1. Need for immunization against influenza  - Flu vaccine trivalent PF, 6mos and older(Flulaval,Afluria,Fluarix,Fluzone)  2. Familial hyperlipidemia  - VASCEPA 1 g capsule; Take 2 capsules (2 g total) by mouth 2 (two) times daily.  Dispense: 360 capsule; Refill: 1 - rosuvastatin (CRESTOR) 40 MG tablet; Take 1 tablet (40 mg total) by mouth daily. In place of Atorvastatin  Dispense: 90 tablet; Refill: 1 - ezetimibe (ZETIA) 10 MG tablet; TAKE ONE TABLET BY MOUTH ONCE DAILY WITH ROSUVASTATIN FOR CHOLESTEROL  Dispense: 90 tablet; Refill: 2 - Evolocumab (REPATHA) 140 MG/ML SOSY; Inject 140 mg into the skin every 14 (fourteen) days.  Dispense: 2.1 mL; Refill: 5  3. Allergic dermatitis  - loratadine (CLARITIN) 10 MG tablet; Take 1 tablet (10 mg total) by mouth daily.  Dispense: 90 tablet; Refill: 1 -  hydrOXYzine (ATARAX) 25 MG tablet; Take 1 tablet (25 mg total) by mouth 2 (two) times daily as needed.  Dispense: 180 tablet; Refill: 1  4. Benign essential HTN  - lisinopril (ZESTRIL) 20 MG tablet; Take 1 tablet (20 mg total) by mouth daily.  Dispense: 90 tablet; Refill: 1  5. Microalbuminuria  - lisinopril (ZESTRIL) 20 MG tablet; Take 1 tablet (20 mg total) by mouth daily.  Dispense: 90 tablet; Refill: 1

## 2022-12-10 ENCOUNTER — Ambulatory Visit: Payer: 59 | Admitting: Family Medicine

## 2022-12-10 ENCOUNTER — Encounter: Payer: Self-pay | Admitting: Family Medicine

## 2022-12-10 VITALS — BP 132/70 | HR 88 | Resp 16 | Ht 70.0 in | Wt 248.0 lb

## 2022-12-10 DIAGNOSIS — I10 Essential (primary) hypertension: Secondary | ICD-10-CM

## 2022-12-10 DIAGNOSIS — Z23 Encounter for immunization: Secondary | ICD-10-CM | POA: Diagnosis not present

## 2022-12-10 DIAGNOSIS — L239 Allergic contact dermatitis, unspecified cause: Secondary | ICD-10-CM

## 2022-12-10 DIAGNOSIS — E7849 Other hyperlipidemia: Secondary | ICD-10-CM

## 2022-12-10 DIAGNOSIS — R809 Proteinuria, unspecified: Secondary | ICD-10-CM

## 2022-12-10 MED ORDER — LISINOPRIL 20 MG PO TABS: 20.0000 mg | ORAL_TABLET | Freq: Every day | ORAL | 1 refills | Status: DC

## 2022-12-10 MED ORDER — REPATHA 140 MG/ML ~~LOC~~ SOSY: 1.0000 | PREFILLED_SYRINGE | SUBCUTANEOUS | 5 refills | Status: DC

## 2022-12-10 MED ORDER — ROSUVASTATIN CALCIUM 40 MG PO TABS: 40.0000 mg | ORAL_TABLET | Freq: Every day | ORAL | 1 refills | Status: DC

## 2022-12-10 MED ORDER — HYDROXYZINE HCL 25 MG PO TABS: 25.0000 mg | ORAL_TABLET | Freq: Two times a day (BID) | ORAL | 1 refills | Status: DC | PRN

## 2022-12-10 MED ORDER — LORATADINE 10 MG PO TABS: 10.0000 mg | ORAL_TABLET | Freq: Every day | ORAL | 1 refills | Status: DC

## 2022-12-10 MED ORDER — EZETIMIBE 10 MG PO TABS: ORAL_TABLET | ORAL | 2 refills | Status: DC

## 2022-12-10 MED ORDER — VASCEPA 1 G PO CAPS: 2.0000 g | ORAL_CAPSULE | Freq: Two times a day (BID) | ORAL | 1 refills | Status: DC

## 2023-03-31 ENCOUNTER — Ambulatory Visit: Payer: No Typology Code available for payment source | Admitting: Family Medicine

## 2023-03-31 ENCOUNTER — Encounter: Payer: Self-pay | Admitting: Family Medicine

## 2023-03-31 VITALS — BP 126/86 | HR 91 | Temp 97.7°F | Resp 16 | Ht 70.0 in | Wt 245.8 lb

## 2023-03-31 DIAGNOSIS — M629 Disorder of muscle, unspecified: Secondary | ICD-10-CM | POA: Diagnosis not present

## 2023-03-31 DIAGNOSIS — R200 Anesthesia of skin: Secondary | ICD-10-CM

## 2023-03-31 DIAGNOSIS — I10 Essential (primary) hypertension: Secondary | ICD-10-CM | POA: Diagnosis not present

## 2023-03-31 DIAGNOSIS — H04123 Dry eye syndrome of bilateral lacrimal glands: Secondary | ICD-10-CM | POA: Diagnosis not present

## 2023-03-31 NOTE — Progress Notes (Signed)
 Name: Isaac Delacruz   MRN: [id number]    DOB: 12-30-1965   Date:03/31/2023       Progress Note  Subjective  Chief Complaint  Chief Complaint  Patient presents with   Medical Management of Chronic Issues   Extremity Weakness    Bilateral weakness behind thighs happens when exposed to cold weather, onset for a month    Discussed the use of AI scribe software for clinical note transcription with the patient, who gave verbal consent to proceed.  History of Present Illness   The patient presents with hamstring tightness and numbness in the left thumb.  He has been experiencing tightness in his hamstrings for about a month. The sensation is described as a cramp that occurs suddenly, sometimes while standing or getting out of bed, and is not triggered by specific activities. The cramp occurs in both hamstrings and is severe enough to require him to 'roll out of bed', or grab his labs when it hits him while standing. There is no history of trauma or changes in medication or diet that could account for these symptoms. He works standing on a concrete floor but no changes in job description.  He also reports numbness in his left thumb, which has been present for about a month. The numbness is constant and localized to the ventral part of the thumb. It does not worsen with neck movement, and there is no associated pain in the neck or shoulder. No runny nose, congestion, or joint pain in other areas.  Additionally, he has been experiencing dry eyes and uses prescribed eye drops to manage the condition. He notes that his eyes are dry upon waking up in the morning, sometimes matted shut, he has seen ophthalmologist and is going back in 2 weeks .   He is currently taking several medications including Repatha, ezetimibe, lisinopril, metformin, Vascepa, and rosuvastatin, and has been on these medications for a while. He also takes vitamin D. No changes in his medication regimen or diet that could explain his  symptoms.        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   Enlarged lymph node 09/22/2018   Seasonal allergic rhinitis 08/18/2016   Folliculitis 05/23/2015   Allergic dermatitis 05/23/2015   History of Bell's palsy 07/18/2014   Benign essential HTN 07/18/2014   Dyslipidemia 07/18/2014   Low back pain 07/18/2014   Vitamin D deficiency 07/18/2014   Dysmetabolic syndrome 07/18/2014   Microalbuminuria 07/18/2014   Morbid obesity (HCC) 07/18/2014   OSA on CPAP 07/18/2014   Tobacco use 07/18/2014    Social History   Tobacco Use   Smoking status: Some Days    Types: Cigars    Start date: 10/12/1997   Smokeless tobacco: Never   Tobacco comments:    Smokes 2 cigars daily  Substance Use Topics   Alcohol use: Yes    Alcohol/week: 0.0 standard drinks of alcohol    Comment: rarely     Current Outpatient Medications:    aspirin 81 MG tablet, Take by mouth., Disp: , Rfl:    Evolocumab (REPATHA) 140 MG/ML SOSY, Inject 140 mg into the skin every 14 (fourteen) days., Disp: 2.1 mL, Rfl: 5   ezetimibe (ZETIA) 10 MG tablet, TAKE ONE TABLET BY MOUTH ONCE DAILY WITH ROSUVASTATIN FOR CHOLESTEROL, Disp: 90 tablet, Rfl: 2   hydrOXYzine (ATARAX) 25 MG tablet, Take 1 tablet (25 mg total) by mouth  2 (two) times daily as needed., Disp: 180 tablet, Rfl: 1   lisinopril (ZESTRIL) 20 MG tablet, Take 1 tablet (20 mg total) by mouth daily., Disp: 90 tablet, Rfl: 1   loratadine (CLARITIN) 10 MG tablet, Take 1 tablet (10 mg total) by mouth daily., Disp: 90 tablet, Rfl: 1   metFORMIN (GLUCOPHAGE) 500 MG tablet, Take 1 tablet (500 mg total) by mouth 2 (two) times daily with a meal., Disp: 180 tablet, Rfl: 1   rosuvastatin (CRESTOR) 40 MG tablet, Take 1 tablet (40 mg total) by mouth daily. In place of Atorvastatin, Disp: 90 tablet, Rfl: 1   VASCEPA 1 g capsule, Take 2 capsules (2 g total) by mouth 2 (two) times daily.,  Disp: 360 capsule, Rfl: 1   Vitamin D, Ergocalciferol, (DRISDOL) 1.25 MG (50000 UNIT) CAPS capsule, Take 1 capsule (50,000 Units total) by mouth every 7 (seven) days., Disp: 12 capsule, Rfl: 1  No Known Allergies  ROS  Ten systems reviewed and is negative except as mentioned in HPI    Objective  Vitals:   03/31/23 0950  BP: 126/86  Pulse: 91  Resp: 16  Temp: 97.7 F (36.5 C)  TempSrc: Oral  SpO2: 99%  Weight: 245 lb 12.8 oz (111.5 kg)  Height: 5\' 10"  (1.778 m)    Body mass index is 35.27 kg/m.    Physical Exam  Constitutional: Patient appears well-developed and well-nourished. Obese  No distress.  HEENT: head atraumatic, normocephalic, pupils equal and reactive to light, neck supple Cardiovascular: Normal rate, regular rhythm and normal heart sounds.  No murmur heard. No BLE edema. Pulmonary/Chest: Effort normal and breath sounds normal. No respiratory distress. Abdominal: Soft.  There is no tenderness. Muscular skeletal: pain with extension of spine, otherwise normal rom, negative straight leg raise Psychiatric: Patient has a normal mood and affect. behavior is normal. Judgment and thought content normal.   Assessment and Plan    Hamstring Cramps: Patient reports cramping in both hamstrings for the past month, not triggered by activity. No recent changes in medication, diet, or trauma. No associated back pain, bowel or bladder incontinence. -Order electrolyte panel to rule out electrolyte imbalance. -Refer to physical therapy for strengthening exercises. -Advise patient to increase fluid intake and consider drinks with electrolytes like Gatorade Zero or Propel.  Numbness in Left Thumb: Patient reports numbness in left thumb for the past month. No associated neck or shoulder pain. No changes in medication or diet. -Refer to orthopedics for nerve conduction study.  Dry Eyes: Patient reports dry eyes for the past month, with symptoms of morning eye closure and dry  eyelids. Currently using prescribed eye drops. -Continue prescribed eye drops. -Recommend warm compresses for symptomatic relief. MdSelect -Advise patient to follow up with ophthalmologist in a couple of weeks.  Eczema: Patient reports dry, crusty skin on face for the past month. No current treatment. -Recommend warm compresses for symptomatic relief. -Advise patient to follow up with dermatologist in June.  Hyperlipidemia: Patient is currently on Repatha, ezetimibe, Vascepa, and rosuvastatin for cholesterol management. -Continue current medications. -we will check CK  Hypertension: Patient is currently on lisinopril for blood pressure management. -Continue lisinopril.  Prediabetes: Patient is currently on metformin for prediabetes management. -Continue metformin.  Vitamin D Deficiency: Patient is currently on Vitamin D supplementation. -Continue Vitamin D supplementation. -Check Vitamin D levels in blood work.

## 2023-03-31 NOTE — Patient Instructions (Signed)
 MDselect on [employer] for warm compress / eye   Hamstring Strain Rehab Ask your health care provider which exercises are safe for you. Do exercises exactly as told by your health care provider and adjust them as directed. It is normal to feel mild stretching, pulling, tightness, or discomfort as you do these exercises. Stop right away if you feel sudden pain or your pain gets worse. Do not begin these exercises until told by your health care provider. Stretching and range-of-motion exercises These exercises warm up your muscles and joints and improve the movement and flexibility of your thighs. These exercises also help to relieve pain, numbness, and tingling. Talk to your health care provider about these restrictions. Knee extension, seated  Sit with your left / right heel propped on a chair, a coffee table, or a footstool. Do not have anything under your knee to support it. Allow your leg muscles to relax, letting gravity straighten out your knee (extension). You should feel a stretch behind your left / right knee. If told by your health care provider, deepen the stretch by placing a __________ weight on your thigh, just above your kneecap. Hold this position for __________ seconds. Repeat __________ times. Complete this exercise __________ times a day. Seated stretch This exercise is sometimes called hamstrings and adductors stretch. Sit on the floor with your legs stretched wide. Keep your knees straight during this exercise. Keeping your head and back in a straight line, bend at your waist to reach for your left foot. You should feel a stretch in your right inner thigh (adductors). Hold this position for __________ seconds. Then slowly return to the upright position. Keeping your head and back in a straight line, bend at your waist to reach forward. You should feel a stretch behind both of your thighs or knees (hamstrings). Hold this position for __________ seconds. Then slowly return to the  upright position. Keeping your head and back in a straight line, bend at your waist to reach for your right foot. You should feel a stretch in your left inner thigh (adductors). Hold this position for __________ seconds. Then slowly return to the upright position. Repeat __________ times. Complete this exercise __________ times a day. Hamstrings stretch, supine  Lie on your back (supine position). Loop a belt or towel over the ball of your left / right foot. The ball of your foot is on the walking surface, right under your toes. Straighten your left / right knee and slowly pull on the belt or towel to raise your leg. Do not let your left / right knee bend while you do this. Keep your other leg flat on the floor. Raise the left / right leg until you feel a gentle stretch behind your left / right knee or thigh (hamstrings). Hold this position for __________ seconds. Slowly return your leg to the starting position. Repeat __________ times. Complete this exercise __________ times a day. Strengthening exercises These exercises build strength and endurance in your thighs. Endurance is the ability to use your muscles for a long time, even after they get tired. Straight leg raises, prone This exercise strengthens the muscles that move the hips (hip extensors). Lie on your abdomen on a firm surface (prone position). Tense the muscles in your buttocks and lift your left / right leg about 4 inches (10 cm). Keep your knee straight as you lift your leg. If you cannot lift your leg that high without arching your back, place a pillow under your hips. Hold  the position for __________ seconds. Slowly lower your leg to the starting position. Allow your muscles to relax completely before you start the next repetition. Repeat __________ times. Complete this exercise __________ times a day. Bridge This exercise strengthens the muscles in your buttocks and the back of your thighs (hip extensors). Lie on your  back on a firm surface with your knees bent and your feet flat on the floor. Tighten your buttocks muscles and lift your bottom off the floor until the trunk of your body is level with your thighs. You should feel the muscles working in your buttocks and the back of your thighs. Do not arch your back. Hold this position for __________ seconds. Slowly lower your hips to the starting position. Let your buttocks muscles relax completely between repetitions. If told by your health care provider, keep your bottom lifted off the floor while you slowly walk your feet away from you as far as you can control. Hold for __________ seconds, then slowly walk your feet back toward you. Repeat __________ times. Complete this exercise __________ times a day. Lateral walking with band This is an exercise in which you walk sideways (lateral), with tension provided by an exercise band. The exercise strengthens the muscles in your hip (hip abductors). Stand in a long hallway. Wrap a loop of exercise band around your legs, just above your knees. Bend your knees gently and drop your hips down and back so your weight is over your heels. Step to the side to move down the length of the hallway, keeping your toes pointed ahead of you and keeping tension in the band. Repeat, leading with your other leg. Repeat __________ times. Complete this exercise __________ times a day. Single leg stand with reaching This exercise is also called eccentric hamstring stretch. Stand on your left / right foot. Keep your big toe down on the floor and try to keep your arch lifted. Slowly reach down toward the floor as far as you can while keeping your balance. Lowering your thigh under tension is called eccentric stretching. Hold this position for __________ seconds. Repeat __________ times. Complete this exercise __________ times a day. Plank, prone This exercise strengthens muscles in your abdomen and core area. Lie on your abdomen on  the floor (prone position),and prop yourself up on your elbows. Your hands should be straight out in front of you, and your elbows should be below your shoulders. Position your feet similar to a push-up position so your toes are on the ground. Tighten your abdominal muscles and lift your body off the floor. Do not arch your back. Do not hold your breath. Hold this position for __________ seconds. Repeat __________ times. Complete this exercise __________ times a day. This information is not intended to replace advice given to you by your health care provider. Make sure you discuss any questions you have with your health care provider. Document Revised: 05/25/2022 Document Reviewed: 07/15/2020 Elsevier Patient Education  2024 [employer].

## 2023-03-31 NOTE — Addendum Note (Signed)
 Addended by: Forde Radon on: 03/31/2023 01:01 PM   Modules accepted: Orders

## 2023-04-01 LAB — CBC WITH DIFFERENTIAL/PLATELET
Absolute Lymphocytes: 2030 {cells}/uL (ref 850–3900)
Absolute Monocytes: 826 {cells}/uL (ref 200–950)
Basophils Absolute: 49 {cells}/uL (ref 0–200)
Basophils Relative: 0.7 %
Eosinophils Absolute: 413 {cells}/uL (ref 15–500)
Eosinophils Relative: 5.9 %
HCT: 48.6 % (ref 38.5–50.0)
Hemoglobin: 15.5 g/dL (ref 13.2–17.1)
MCH: 28.3 pg (ref 27.0–33.0)
MCHC: 31.9 g/dL — ABNORMAL LOW (ref 32.0–36.0)
MCV: 88.7 fL (ref 80.0–100.0)
MPV: 11.6 fL (ref 7.5–12.5)
Monocytes Relative: 11.8 %
Neutro Abs: 3682 {cells}/uL (ref 1500–7800)
Neutrophils Relative %: 52.6 %
Platelets: 196 10*3/uL (ref 140–400)
RBC: 5.48 10*6/uL (ref 4.20–5.80)
RDW: 14 % (ref 11.0–15.0)
Total Lymphocyte: 29 %
WBC: 7 10*3/uL (ref 3.8–10.8)

## 2023-04-01 LAB — COMPLETE METABOLIC PANEL WITH GFR
AG Ratio: 1.2 (calc) (ref 1.0–2.5)
ALT: 12 U/L (ref 9–46)
AST: 16 U/L (ref 10–35)
Albumin: 4.2 g/dL (ref 3.6–5.1)
Alkaline phosphatase (APISO): 60 U/L (ref 35–144)
BUN: 15 mg/dL (ref 7–25)
CO2: 28 mmol/L (ref 20–32)
Calcium: 9.4 mg/dL (ref 8.6–10.3)
Chloride: 102 mmol/L (ref 98–110)
Creat: 1.15 mg/dL (ref 0.70–1.30)
Globulin: 3.5 g/dL (ref 1.9–3.7)
Glucose, Bld: 87 mg/dL (ref 65–99)
Potassium: 4.5 mmol/L (ref 3.5–5.3)
Sodium: 137 mmol/L (ref 135–146)
Total Bilirubin: 0.5 mg/dL (ref 0.2–1.2)
Total Protein: 7.7 g/dL (ref 6.1–8.1)
eGFR: 75 mL/min/{1.73_m2} (ref >=60)

## 2023-04-01 LAB — MAGNESIUM: Magnesium: 2 mg/dL (ref 1.5–2.5)

## 2023-04-01 LAB — CK: Total CK: 277 U/L (ref 23–325)

## 2023-06-10 ENCOUNTER — Ambulatory Visit: Payer: Self-pay | Admitting: Family Medicine

## 2023-06-30 ENCOUNTER — Ambulatory Visit: Payer: No Typology Code available for payment source | Admitting: Family Medicine

## 2023-06-30 ENCOUNTER — Encounter: Payer: Self-pay | Admitting: Family Medicine

## 2023-06-30 VITALS — BP 116/70 | HR 91 | Resp 16 | Ht 70.0 in | Wt 248.5 lb

## 2023-06-30 DIAGNOSIS — L239 Allergic contact dermatitis, unspecified cause: Secondary | ICD-10-CM

## 2023-06-30 DIAGNOSIS — R809 Proteinuria, unspecified: Secondary | ICD-10-CM

## 2023-06-30 DIAGNOSIS — I10 Essential (primary) hypertension: Secondary | ICD-10-CM | POA: Diagnosis not present

## 2023-06-30 DIAGNOSIS — E559 Vitamin D deficiency, unspecified: Secondary | ICD-10-CM

## 2023-06-30 DIAGNOSIS — E8881 Metabolic syndrome: Secondary | ICD-10-CM

## 2023-06-30 DIAGNOSIS — E7849 Other hyperlipidemia: Secondary | ICD-10-CM | POA: Diagnosis not present

## 2023-06-30 DIAGNOSIS — G4733 Obstructive sleep apnea (adult) (pediatric): Secondary | ICD-10-CM

## 2023-06-30 MED ORDER — ROSUVASTATIN CALCIUM 40 MG PO TABS: 40.0000 mg | ORAL_TABLET | Freq: Every day | ORAL | 1 refills | Status: DC

## 2023-06-30 MED ORDER — LORATADINE 10 MG PO TABS: 10.0000 mg | ORAL_TABLET | Freq: Every day | ORAL | 1 refills | Status: DC

## 2023-06-30 MED ORDER — LISINOPRIL 20 MG PO TABS: 20.0000 mg | ORAL_TABLET | Freq: Every day | ORAL | 1 refills | Status: DC

## 2023-06-30 MED ORDER — REPATHA 140 MG/ML ~~LOC~~ SOSY: 1.0000 | PREFILLED_SYRINGE | SUBCUTANEOUS | 5 refills | Status: DC

## 2023-06-30 MED ORDER — HYDROXYZINE HCL 25 MG PO TABS: 25.0000 mg | ORAL_TABLET | Freq: Two times a day (BID) | ORAL | 1 refills | Status: DC | PRN

## 2023-06-30 MED ORDER — VASCEPA 1 G PO CAPS: 2.0000 g | ORAL_CAPSULE | Freq: Two times a day (BID) | ORAL | 1 refills | Status: DC

## 2023-06-30 MED ORDER — TRIAMCINOLONE ACETONIDE 0.1 % EX CREA
1.0000 | TOPICAL_CREAM | Freq: Two times a day (BID) | CUTANEOUS | 1 refills | Status: AC
Start: 2023-06-30 — End: ?

## 2023-06-30 MED ORDER — METFORMIN HCL 500 MG PO TABS: 500.0000 mg | ORAL_TABLET | Freq: Every day | ORAL | 1 refills | Status: DC

## 2023-06-30 NOTE — Progress Notes (Signed)
 Name: Isaac Delacruz   MRN: [id number]    DOB: 09-24-1965   Date:06/30/2023       Progress Note  Subjective  Chief Complaint  Chief Complaint  Patient presents with   Medical Management of Chronic Issues   Discussed the use of AI scribe software for clinical note transcription with the patient, who gave verbal consent to proceed.  History of Present Illness Isaac Delacruz is a 58 year old male who presents for a regular follow-up visit.  He hashypertension and is currently taking lisinopril  without side effects. No chest pain, palpitations, or dizziness upon standing.  For prediabetes, he is taking metformin  500 mg once a day. No diarrhea or symptoms of diabetes such as increased hunger, thirst, or urination. His last A1c was 6.2, which has gradually increased from 5.9 over time.  He has familial hyperlipidemia and is on Repatha  injections every two weeks, Vascepa  two capsules twice a day, and rosuvastatin  40mg  daily . He previously experienced muscle aches with atorvastatin , which he no longer takes. His LDL cholesterol has improved from over 160 mg/dL to 409 mg/dL with current treatment, however states he has been more compliant with Repatha  since Fall .  He has morbid obesity with a BMI over 35 and associated co-morbidities such as OSA, HTN and dyslipidemia, reminded him the importance of losing weight with dietary modifications, specially cutting down on sweet beverages. He has been drinking more water lately   He has a history of eczema and uses hydroxyzine  twice a day for itching. He also uses Claritin  and applies cocoa lotion for dry skin. He no longer uses triamcinolone regularly but would like a refill today   He has obstructive sleep apnea and uses a CPAP machine for 4-5 hours during sleep, which helps him feel rested. He is also taking vitamin D  weekly as his levels were low last year.    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    Familial hyperlipidemia 06/09/2022   Enlarged lymph node 09/22/2018   Seasonal allergic rhinitis 08/18/2016   Folliculitis 05/23/2015   Allergic dermatitis 05/23/2015   History of Bell's palsy 07/18/2014   Benign essential HTN 07/18/2014   Dyslipidemia 07/18/2014   Low back pain 07/18/2014   Vitamin D  deficiency 07/18/2014   Dysmetabolic syndrome 07/18/2014   Microalbuminuria 07/18/2014   Morbid obesity (HCC) 07/18/2014   OSA on CPAP 07/18/2014   Tobacco use 07/18/2014    Past Surgical History:  Procedure Laterality Date   COLONOSCOPY     COLONOSCOPY WITH PROPOFOL  N/A 11/11/2016   Procedure: COLONOSCOPY WITH PROPOFOL ;  Surgeon: Selena Daily, MD;  [facility]: [facility];  Service: Endoscopy;  Laterality: N/A;  sleep apnea    Family History  Problem Relation Age of Onset   Cancer Mother 30       Lung   Cancer Father        Prostate   Diabetes Father    Hypertension Brother    Cancer Maternal Aunt        Lung   Heart disease Brother     Social History   Tobacco Use   Smoking status: Some Days    Types: Cigars    Start date: 10/12/1997   Smokeless tobacco: Never   Tobacco comments:    Smokes 2 cigars daily  Substance Use Topics   Alcohol use: Yes    Alcohol/week: 0.0 standard drinks of alcohol    Comment:  rarely     Current Outpatient Medications:    aspirin 81 MG tablet, Take by mouth., Disp: , Rfl:    Evolocumab  (REPATHA ) 140 MG/ML SOSY, Inject 140 mg into the skin every 14 (fourteen) days., Disp: 2.1 mL, Rfl: 5   ezetimibe  (ZETIA ) 10 MG tablet, TAKE ONE TABLET BY MOUTH ONCE DAILY WITH ROSUVASTATIN  FOR CHOLESTEROL, Disp: 90 tablet, Rfl: 2   hydrOXYzine  (ATARAX ) 25 MG tablet, Take 1 tablet (25 mg total) by mouth 2 (two) times daily as needed., Disp: 180 tablet, Rfl: 1   lisinopril  (ZESTRIL ) 20 MG tablet, Take 1 tablet (20 mg total) by mouth daily., Disp: 90 tablet, Rfl: 1   loratadine  (CLARITIN ) 10 MG tablet, Take 1 tablet (10 mg total) by mouth  daily., Disp: 90 tablet, Rfl: 1   metFORMIN  (GLUCOPHAGE ) 500 MG tablet, Take 1 tablet (500 mg total) by mouth 2 (two) times daily with a meal., Disp: 180 tablet, Rfl: 1   rosuvastatin  (CRESTOR ) 40 MG tablet, Take 1 tablet (40 mg total) by mouth daily. In place of Atorvastatin , Disp: 90 tablet, Rfl: 1   VASCEPA  1 g capsule, Take 2 capsules (2 g total) by mouth 2 (two) times daily., Disp: 360 capsule, Rfl: 1   Vitamin D , Ergocalciferol , (DRISDOL ) 1.25 MG (50000 UNIT) CAPS capsule, Take 1 capsule (50,000 Units total) by mouth every 7 (seven) days., Disp: 12 capsule, Rfl: 1  No Known Allergies  I personally reviewed active problem list, medication list, allergies, family history with the patient/caregiver today.   ROS  Ten systems reviewed and is negative except as mentioned in HPI    Objective Physical Exam Constitutional: Patient appears well-developed and well-nourished. Obese  No distress.  HEENT: head atraumatic, normocephalic, pupils equal and reactive to light, neck supple Cardiovascular: Normal rate, regular rhythm and normal heart sounds.  No murmur heard. No BLE edema. Pulmonary/Chest: Effort normal and breath sounds normal. No respiratory distress. Abdominal: Soft.  There is no tenderness. Skin: dry skin with some hyperpigmentation arms and nuchal area  Psychiatric: Patient has a normal mood and affect. behavior is normal. Judgment and thought content normal.     Vitals:   06/30/23 0904  BP: 116/70  Pulse: 91  Resp: 16  SpO2: 99%  Weight: 248 lb 8 oz (112.7 kg)  Height: 5\' 10"  (1.778 m)    Body mass index is 35.66 kg/m.    PHQ2/9:    06/30/2023    9:04 AM 03/31/2023    9:49 AM 12/10/2022    8:07 AM 09/23/2022    7:39 AM 06/09/2022    7:53 AM  Depression screen PHQ 2/9  Decreased Interest 0 0 0 0 0  Down, Depressed, Hopeless 0 0 0 0 0  PHQ - 2 Score 0 0 0 0 0  Altered sleeping  0 0 0 0  Tired, decreased energy  0 0 0 0  Change in appetite  0 0 0 0  Feeling bad  or failure about yourself   0 0 0 0  Trouble concentrating  0 0 0 0  Moving slowly or fidgety/restless  0 0 0 0  Suicidal thoughts  0 0 0 0  PHQ-9 Score  0 0 0 0  Difficult doing work/chores  Not difficult at all       phq 9 is negative  Fall Risk:    06/30/2023    9:01 AM 12/10/2022    8:07 AM 09/23/2022    7:39 AM 06/09/2022    7:53 AM  12/09/2021    9:30 AM  Fall Risk   Falls in the past year? 0 0 0 0 0  Number falls in past yr: 0 0 0 0 0  Injury with Fall? 0 0 0 0 0  Risk for fall due to : No Fall Risks No Fall Risks No Fall Risks No Fall Risks No Fall Risks  Follow up Falls prevention discussed;Education provided;Falls evaluation completed  Falls prevention discussed Falls prevention discussed Falls prevention discussed     Assessment & Plan Morbid obesity Morbid obesity with BMI over 35, associated with hypertension, familial hyperlipidemia, and obstructive sleep apnea. Emphasized dietary changes to aid weight loss and improve associated conditions. - Encourage dietary modifications to reduce sweet drinks and fried foods. - Advise on the importance of weight loss for overall health improvement.  Hypertension Hypertension well-controlled with current medication. Blood pressure 116/70 mmHg. No side effects from lisinopril . - Continue lisinopril  as prescribed.  Familial hyperlipidemia Managed with Repatha , Vascepa , and rosuvastatin . LDL improved to 134 mg/dL but above target <604 mg/dL. Consistent Repatha  therapy may further improve lipid levels. - Continue Repatha  every 14 days. - Continue Vascepa  two capsules twice daily. - Continue rosuvastatin  as prescribed. - Monitor lipid levels for further improvement.  Obstructive sleep apnea Managed with CPAP therapy. Uses CPAP for 4-5 hours during sleep and reports feeling rested. - Continue CPAP therapy as currently used.  Prediabetes Prediabetes with A1c increase from 5.9 to 6.2. No symptoms of diabetes. Advised to reduce  sugary drinks to prevent progression. - Continue metformin  500 mg once daily. - Check A1c during physical examination. - Encourage reduction of sugary drinks and increase water intake.  Eczema Eczema with dry spots. Managed with hydroxyzine , Claritin , and cocoa butter. Triamcinolone cream prescribed for dry spots. - Prescribe triamcinolone cream for dry spots as needed. - Continue hydroxyzine  and Claritin  for itching. - Encourage use of cocoa butter for moisturizing.

## 2023-07-05 ENCOUNTER — Telehealth: Payer: Self-pay | Admitting: Pharmacy Technician

## 2023-07-05 ENCOUNTER — Other Ambulatory Visit (HOSPITAL_BASED_OUTPATIENT_CLINIC_OR_DEPARTMENT_OTHER): Payer: Self-pay

## 2023-07-05 ENCOUNTER — Other Ambulatory Visit (HOSPITAL_COMMUNITY): Payer: Self-pay

## 2023-07-05 NOTE — Telephone Encounter (Signed)
 Pharmacy Patient Advocate Encounter   Received notification from CoverMyMeds that prior authorization for Repatha  140MG /ML syringes is required/requested.   Insurance verification completed.   The patient is insured through [facility] .   Per test claim: PA required; PA submitted to above mentioned insurance via CoverMyMeds Key/confirmation #/EOC [id number] Status is pending

## 2023-07-06 ENCOUNTER — Other Ambulatory Visit (HOSPITAL_COMMUNITY): Payer: Self-pay

## 2023-07-06 NOTE — Telephone Encounter (Signed)
 Pharmacy Patient Advocate Encounter  Received notification from [facility] that Prior Authorization for Repatha  140MG /ML syringes has been APPROVED from 07/05/23 to 07/04/24. Ran test claim, Copay is $100.00 FOR 42 DAYS. This test claim was processed through [facility]- copay amounts may vary at other pharmacies due to pharmacy/plan contracts, or as the patient moves through the different stages of their insurance plan.   PA #/Case ID/Reference #: [id number]

## 2023-09-24 ENCOUNTER — Encounter: Payer: Self-pay | Admitting: Family Medicine

## 2023-09-24 ENCOUNTER — Ambulatory Visit (INDEPENDENT_AMBULATORY_CARE_PROVIDER_SITE_OTHER): Payer: Self-pay | Admitting: Family Medicine

## 2023-09-24 VITALS — BP 128/76 | HR 84 | Resp 16 | Ht 67.0 in | Wt 248.8 lb

## 2023-09-24 DIAGNOSIS — Z0000 Encounter for general adult medical examination without abnormal findings: Secondary | ICD-10-CM

## 2023-09-24 DIAGNOSIS — Z125 Encounter for screening for malignant neoplasm of prostate: Secondary | ICD-10-CM

## 2023-09-24 DIAGNOSIS — E559 Vitamin D deficiency, unspecified: Secondary | ICD-10-CM

## 2023-09-24 DIAGNOSIS — E8881 Metabolic syndrome: Secondary | ICD-10-CM | POA: Diagnosis not present

## 2023-09-24 DIAGNOSIS — E7849 Other hyperlipidemia: Secondary | ICD-10-CM

## 2023-09-24 NOTE — Patient Instructions (Signed)

## 2023-09-24 NOTE — Progress Notes (Signed)
 Name: Isaac Delacruz   MRN: [id number]    DOB: April 01, 1965   Date:09/24/2023       Progress Note  Subjective  Chief Complaint  Chief Complaint  Patient presents with   Annual Exam   Extremity Weakness    R leg on going for 3 weeks out of no where gives out on him, he works in concrete for years     HPI  Patient presents for annual CPE .   IPSS     Row Name 09/24/23 0805         International Prostate Symptom Score   How often have you had the sensation of not emptying your bladder? Not at All     How often have you had to urinate less than every two hours? Not at All     How often have you found you stopped and started again several times when you urinated? Not at All     How often have you found it difficult to postpone urination? Not at All     How often have you had a weak urinary stream? Not at All     How often have you had to strain to start urination? Not at All     How many times did you typically get up at night to urinate? 1 Time     Total IPSS Score 1       Quality of Life due to urinary symptoms   If you were to spend the rest of your life with your urinary condition just the way it is now how would you feel about that? Pleased        Diet: eats out about twice a week, adding more fruit to his diet and drinking more water Exercise: has a physical job and mows the yard a couple of times a week, discussed importance of regular activity outside of work  Last Dental Exam: up to date Last Eye Exam: up to date   Depression: phq 9 is negative    09/24/2023    7:58 AM 06/30/2023    9:04 AM 03/31/2023    9:49 AM 12/10/2022    8:07 AM 09/23/2022    7:39 AM  Depression screen PHQ 2/9  Decreased Interest 0 0 0 0 0  Down, Depressed, Hopeless 0 0 0 0 0  PHQ - 2 Score 0 0 0 0 0  Altered sleeping   0 0 0  Tired, decreased energy   0 0 0  Change in appetite   0 0 0  Feeling bad or failure about yourself    0 0 0  Trouble concentrating   0 0 0  Moving slowly or  fidgety/restless   0 0 0  Suicidal thoughts   0 0 0  PHQ-9 Score   0 0 0  Difficult doing work/chores   Not difficult at all      Hypertension:  BP Readings from Last 3 Encounters:  09/24/23 128/76  06/30/23 116/70  03/31/23 126/86    Obesity: Wt Readings from Last 3 Encounters:  09/24/23 248 lb 12.8 oz (112.9 kg)  06/30/23 248 lb 8 oz (112.7 kg)  03/31/23 245 lb 12.8 oz (111.5 kg)   BMI Readings from Last 3 Encounters:  09/24/23 38.97 kg/m  06/30/23 35.66 kg/m  03/31/23 35.27 kg/m     [employer] Visit from 09/24/2023 in [facility]  AUDIT-C Score 0    Domestic Partner STD testing and prevention (  HIV/chl/gon/syphilis):  not applicable Sexual history: one partner, no problems  Hep C Screening: completed Skin cancer: Discussed monitoring for atypical lesions. He has severe eczema but okay with topical medication Colorectal cancer: repeat in 2028  Prostate cancer:  yes Lab Results  Component Value Date   PSA 1.31 09/23/2022   PSA 0.86 09/17/2021   PSA 0.80 09/10/2020     Lung cancer:  Low Dose CT Chest recommended if Age 31-80 years, 30 pack-year currently smoking OR have quit w/in 15years. Patient  is not a candidate for screening   AAA: The USPSTF recommends one-time screening with ultrasonography in men ages 87 to 75 years who have ever smoked. Patient   is not a candidate for screening  ECG:  2025  Vaccines: reviewed with the patient. Discussed yearly flu shot  Advanced Care Planning: A voluntary discussion about advance care planning including the explanation and discussion of advance directives.  Discussed health care proxy and Living will, and the patient was able to identify a health care proxy as daughter .  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   Enlarged  lymph node 09/22/2018   Seasonal allergic rhinitis 08/18/2016   Folliculitis 05/23/2015   Allergic dermatitis 05/23/2015   History of Bell's palsy 07/18/2014   Benign essential HTN 07/18/2014   Dyslipidemia 07/18/2014   Low back pain 07/18/2014   Vitamin D  deficiency 07/18/2014   Dysmetabolic syndrome 07/18/2014   Microalbuminuria 07/18/2014   Morbid obesity (HCC) 07/18/2014   OSA on CPAP 07/18/2014   Tobacco use 07/18/2014    Past Surgical History:  Procedure Laterality Date   COLONOSCOPY     COLONOSCOPY WITH PROPOFOL  N/A 11/11/2016   Procedure: COLONOSCOPY WITH PROPOFOL ;  Surgeon: Unk Corinn Skiff, MD;  Location: [facility];  Service: Endoscopy;  Laterality: N/A;  sleep apnea    Family History  Problem Relation Age of Onset   Cancer Mother 22       Lung   Cancer Father        Prostate   Diabetes Father    Hypertension Brother    Cancer Maternal Aunt        Lung   Heart disease Brother     Social History   Socioeconomic History   Marital status: Media planner    Spouse name: Dorris    Number of children: 7   Years of education: Not on file   Highest education level: 11th grade  Occupational History   Occupation: Scientist, physiological at the [employer]   Tobacco Use   Smoking status: Some Days    Types: Cigars    Start date: 10/12/1997   Smokeless tobacco: Never   Tobacco comments:    Smokes 2 cigars daily  Vaping Use   Vaping status: Never Used  Substance and Sexual Activity   Alcohol use: Not Currently    Comment: rarely   Drug use: No   Sexual activity: Yes    Partners: Female  Other Topics Concern   Not on file  Social History Narrative   Works full time at [employer] in the [employer]    Lives with wife  and they have 3 children together, just one child at home    Social Drivers of Health   Financial Resource Strain: Low Risk  (09/24/2023)  Overall Financial Resource Strain (CARDIA)    Difficulty of Paying Living Expenses: Not hard at all   Food Insecurity: No Food Insecurity (09/24/2023)   Hunger Vital Sign    Worried About Running Out of Food in the Last Year: Never true    Ran Out of Food in the Last Year: Never true  Transportation Needs: No Transportation Needs (09/24/2023)   PRAPARE - Administrator, Civil Service (Medical): No    Lack of Transportation (Non-Medical): No  Physical Activity: Inactive (09/24/2023)   Exercise Vital Sign    Days of Exercise per Week: 0 days    Minutes of Exercise per Session: 0 min  Stress: No Stress Concern Present (09/24/2023)   [employer] of Occupational Health - Occupational Stress Questionnaire    Feeling of Stress: Not at all  Social Connections: Moderately Isolated (09/24/2023)   Social Connection and Isolation Panel    Frequency of Communication with Friends and Family: Once a week    Frequency of Social Gatherings with Friends and Family: Twice a week    Attends Religious Services: More than 4 times per year    Active Member of [employer] or Organizations: No    Attends Banker Meetings: Never    Marital Status: Separated  Intimate Partner Violence: Not At Risk (09/24/2023)   Humiliation, Afraid, Rape, and Kick questionnaire    Fear of Current or Ex-Partner: No    Emotionally Abused: No    Physically Abused: No    Sexually Abused: No     Current Outpatient Medications:    aspirin 81 MG tablet, Take by mouth., Disp: , Rfl:    Evolocumab  (REPATHA ) 140 MG/ML SOSY, Inject 140 mg into the skin every 14 (fourteen) days., Disp: 2.1 mL, Rfl: 5   hydrOXYzine  (ATARAX ) 25 MG tablet, Take 1 tablet (25 mg total) by mouth 2 (two) times daily as needed., Disp: 180 tablet, Rfl: 1   lisinopril  (ZESTRIL ) 20 MG tablet, Take 1 tablet (20 mg total) by mouth daily., Disp: 90 tablet, Rfl: 1   loratadine  (CLARITIN ) 10 MG tablet, Take 1 tablet (10 mg total) by mouth daily., Disp: 90 tablet, Rfl: 1   metFORMIN  (GLUCOPHAGE ) 500 MG tablet, Take 1 tablet (500 mg total) by mouth  daily with breakfast., Disp: 90 tablet, Rfl: 1   rosuvastatin  (CRESTOR ) 40 MG tablet, Take 1 tablet (40 mg total) by mouth daily., Disp: 90 tablet, Rfl: 1   triamcinolone  cream (KENALOG ) 0.1 %, Apply 1 Application topically 2 (two) times daily., Disp: 453.6 g, Rfl: 1   VASCEPA  1 g capsule, Take 2 capsules (2 g total) by mouth 2 (two) times daily., Disp: 360 capsule, Rfl: 1   Vitamin D , Ergocalciferol , (DRISDOL ) 1.25 MG (50000 UNIT) CAPS capsule, Take 1 capsule (50,000 Units total) by mouth every 7 (seven) days., Disp: 12 capsule, Rfl: 1  No Known Allergies   ROS  Constitutional: Negative for fever or weight change.  Respiratory: Negative for cough and shortness of breath.   Cardiovascular: Negative for chest pain or palpitations.  Gastrointestinal: Negative for abdominal pain, no bowel changes.  Musculoskeletal: right leg pain causing antalgic gait, he wants to return to discuss it next week  Skin: positive for chronic rash  Neurological: Negative for dizziness or headache.  No other specific complaints in a complete review of systems (except as listed in HPI above).    Objective  Vitals:   09/24/23 0808  BP: 128/76  Pulse: 84  Resp: 16  SpO2: 100%  Weight: 248 lb 12.8 oz (112.9 kg)  Height: 5' 7 (1.702 m)    Body mass index is 38.97 kg/m.  Physical Exam  Constitutional: Patient appears well-developed and well-nourished. No distress.  HENT: Head: Normocephalic and atraumatic. Ears: B TMs ok, no erythema or effusion; Nose: Nose normal. Mouth/Throat: Oropharynx is clear and moist. No oropharyngeal exudate.  Eyes: Conjunctivae and EOM are normal. Pupils are equal, round, and reactive to light. No scleral icterus.  Neck: Normal range of motion. Neck supple. No JVD present. No thyromegaly present.  Cardiovascular: Normal rate, regular rhythm and normal heart sounds.  No murmur heard. No BLE edema. Pulmonary/Chest: Effort normal and breath sounds normal. No respiratory  distress. Abdominal: Soft. Bowel sounds are normal, no distension. There is no tenderness. no masses MALE GENITALIA: Normal descended testes bilaterally, no masses palpated, no hernias, no lesions, no discharge RECTAL: Prostate normal size and consistency, no rectal masses or hemorrhoids Musculoskeletal: Normal range of motion, no joint effusions. No gross deformities Neurological: he is alert and oriented to person, place, and time. No cranial nerve deficit. Coordination, balance, strength, speech and gait are normal.  Skin: Skin is warm and dry. he has lichen on right lower extremities, face and scalp have improved with medication Psychiatric: Patient has a normal mood and affect. behavior is normal. Judgment and thought content normal.     Assessment & Plan  1. Well adult exam (Primary)  - Lipid panel - Hemoglobin A1c - VITAMIN D  25 Hydroxy (Vit-D Deficiency, Fractures) - CBC with Differential/Platelet - PSA  2. Familial hyperlipidemia  - Lipid panel  3. Vitamin D  deficiency  - VITAMIN D  25 Hydroxy (Vit-D Deficiency, Fractures)  4. Dysmetabolic syndrome  - Hemoglobin A1c  5. Screening for prostate cancer  - PSA    -Prostate cancer screening and PSA options (with potential risks and benefits of testing vs not testing) were discussed along with recent recs/guidelines. -USPSTF grade A and B recommendations reviewed with patient; age-appropriate recommendations, preventive care, screening tests, etc discussed and encouraged; healthy living encouraged; see AVS for patient education given to patient -Discussed importance of 150 minutes of physical activity weekly, eat two servings of fish weekly, eat one serving of tree nuts ( cashews, pistachios, pecans, almonds.SABRA) every other day, eat 6 servings of fruit/vegetables daily and drink plenty of water and avoid sweet beverages.  -Reviewed Health Maintenance: yes

## 2023-09-25 LAB — CBC WITH DIFFERENTIAL/PLATELET
Absolute Lymphocytes: 1635 {cells}/uL (ref 850–3900)
Absolute Monocytes: 811 {cells}/uL (ref 200–950)
Basophils Absolute: 47 {cells}/uL (ref 0–200)
Basophils Relative: 0.7 %
Eosinophils Absolute: 302 {cells}/uL (ref 15–500)
Eosinophils Relative: 4.5 %
HCT: 48.5 % (ref 38.5–50.0)
Hemoglobin: 15.5 g/dL (ref 13.2–17.1)
MCH: 28.5 pg (ref 27.0–33.0)
MCHC: 32 g/dL (ref 32.0–36.0)
MCV: 89.2 fL (ref 80.0–100.0)
MPV: 12 fL (ref 7.5–12.5)
Monocytes Relative: 12.1 %
Neutro Abs: 3906 {cells}/uL (ref 1500–7800)
Neutrophils Relative %: 58.3 %
Platelets: 192 Thousand/uL (ref 140–400)
RBC: 5.44 Million/uL (ref 4.20–5.80)
RDW: 14.5 % (ref 11.0–15.0)
Total Lymphocyte: 24.4 %
WBC: 6.7 Thousand/uL (ref 3.8–10.8)

## 2023-09-25 LAB — HEMOGLOBIN A1C
Hgb A1c MFr Bld: 6.5 % — ABNORMAL HIGH (ref <5.7)
Mean Plasma Glucose: 140 mg/dL
eAG (mmol/L): 7.7 mmol/L

## 2023-09-25 LAB — LIPID PANEL
Cholesterol: 228 mg/dL — ABNORMAL HIGH (ref <200)
HDL: 39 mg/dL — ABNORMAL LOW (ref >=40)
LDL Cholesterol (Calc): 158 mg/dL — ABNORMAL HIGH
Non-HDL Cholesterol (Calc): 189 mg/dL — ABNORMAL HIGH (ref <130)
Total CHOL/HDL Ratio: 5.8 (calc) — ABNORMAL HIGH (ref <5.0)
Triglycerides: 169 mg/dL — ABNORMAL HIGH (ref <150)

## 2023-09-25 LAB — PSA: PSA: 1.47 ng/mL (ref <=4.00)

## 2023-09-25 LAB — VITAMIN D 25 HYDROXY (VIT D DEFICIENCY, FRACTURES): Vit D, 25-Hydroxy: 22 ng/mL — ABNORMAL LOW (ref 30–100)

## 2023-09-27 ENCOUNTER — Ambulatory Visit: Payer: Self-pay | Admitting: Family Medicine

## 2023-09-28 ENCOUNTER — Ambulatory Visit: Admitting: Family Medicine

## 2023-09-29 ENCOUNTER — Other Ambulatory Visit: Payer: Self-pay | Admitting: Family Medicine

## 2023-09-29 DIAGNOSIS — E559 Vitamin D deficiency, unspecified: Secondary | ICD-10-CM

## 2023-09-29 MED ORDER — VITAMIN D (ERGOCALCIFEROL) 1.25 MG (50000 UNIT) PO CAPS: 50000.0000 [IU] | ORAL_CAPSULE | ORAL | 1 refills | Status: AC

## 2023-11-09 ENCOUNTER — Ambulatory Visit: Admitting: Family Medicine

## 2023-11-09 ENCOUNTER — Encounter: Payer: Self-pay | Admitting: Family Medicine

## 2023-11-09 VITALS — BP 128/74 | HR 91 | Resp 16 | Ht 67.0 in | Wt 248.9 lb

## 2023-11-09 DIAGNOSIS — E7849 Other hyperlipidemia: Secondary | ICD-10-CM | POA: Diagnosis not present

## 2023-11-09 DIAGNOSIS — I10 Essential (primary) hypertension: Secondary | ICD-10-CM

## 2023-11-09 DIAGNOSIS — M25561 Pain in right knee: Secondary | ICD-10-CM

## 2023-11-09 DIAGNOSIS — G4733 Obstructive sleep apnea (adult) (pediatric): Secondary | ICD-10-CM

## 2023-11-09 DIAGNOSIS — Z23 Encounter for immunization: Secondary | ICD-10-CM | POA: Diagnosis not present

## 2023-11-09 DIAGNOSIS — L239 Allergic contact dermatitis, unspecified cause: Secondary | ICD-10-CM

## 2023-11-09 DIAGNOSIS — G8929 Other chronic pain: Secondary | ICD-10-CM

## 2023-11-09 DIAGNOSIS — E8881 Metabolic syndrome: Secondary | ICD-10-CM

## 2023-11-09 MED ORDER — ROSUVASTATIN CALCIUM 40 MG PO TABS: 40.0000 mg | ORAL_TABLET | Freq: Every day | ORAL | 1 refills | Status: AC

## 2023-11-09 NOTE — Progress Notes (Signed)
 Name: Isaac Delacruz   MRN: [id number]    DOB: 1966/01/09   Date:11/09/2023       Progress Note  Subjective  Chief Complaint  Chief Complaint  Patient presents with   Leg Pain    R leg, hx of working on concrete, pt would like x-ray, still has some flare ups when it hurts   Discussed the use of AI scribe software for clinical note transcription with the patient, who gave verbal consent to proceed.  History of Present Illness Isaac Delacruz is a 58 year old male with chronic right knee pain who presents for follow-up.  He has chronic right knee pain, described as aching and located in the anterior part of the knee. The pain is persistent and worsens with cold temperatures. He has a history of working on concrete floors, which he believes contributes to his symptoms. No knee swelling or instability is noted.  He has hyperlipidemia and is on rosuvastatin  40 mg, but his LDL cholesterol remains elevated at 158 mg/dL. He recently started Repatha  last month. No side effects from his medications are reported.  He is managing prediabetes with metformin , taking one tablet daily without side effects. His last A1c was 6.5%. He is attempting to control his diet by reducing sweets and increasing water intake.  He has a history of morbid obesity with a BMI over 35 and co-morbidities such as HTN , OSA  He engages in physical activity through house chores and walking, and is trying to improve his diet by cutting down on sweets.  His hypertension is well-controlled with lisinopril  20 mg daily.  He uses a CPAP machine for obstructive sleep apnea, wearing it every night for at least three hours. He reports feeling rested upon waking.  He has allergic dermatitis, for which he takes hydroxyzine  and uses triamcinolone  cream. His skin condition is much improved.    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   Enlarged lymph  node 09/22/2018   Seasonal allergic rhinitis 08/18/2016   Folliculitis 05/23/2015   Allergic dermatitis 05/23/2015   History of Bell's palsy 07/18/2014   Benign essential HTN 07/18/2014   Dyslipidemia 07/18/2014   Low back pain 07/18/2014   Vitamin D  deficiency 07/18/2014   Dysmetabolic syndrome 07/18/2014   Microalbuminuria 07/18/2014   Morbid obesity (HCC) 07/18/2014   OSA on CPAP 07/18/2014   Tobacco use 07/18/2014    Past Surgical History:  Procedure Laterality Date   COLONOSCOPY     COLONOSCOPY WITH PROPOFOL  N/A 11/11/2016   Procedure: COLONOSCOPY WITH PROPOFOL ;  Surgeon: Unk Corinn Skiff, MD;  Location: [facility];  Service: Endoscopy;  Laterality: N/A;  sleep apnea    Family History  Problem Relation Age of Onset   Cancer Mother 63       Lung   Cancer Father        Prostate   Diabetes Father    Hypertension Brother    Cancer Maternal Aunt        Lung   Heart disease Brother     Social History   Tobacco Use   Smoking status: Some Days    Types: Cigars    Start date: 10/12/1997   Smokeless tobacco: Never   Tobacco comments:    Smokes 2 cigars daily  Substance Use Topics   Alcohol use: Not Currently    Comment: rarely     Current Outpatient Medications:  aspirin 81 MG tablet, Take by mouth., Disp: , Rfl:    Evolocumab  (REPATHA ) 140 MG/ML SOSY, Inject 140 mg into the skin every 14 (fourteen) days., Disp: 2.1 mL, Rfl: 5   hydrOXYzine  (ATARAX ) 25 MG tablet, Take 1 tablet (25 mg total) by mouth 2 (two) times daily as needed., Disp: 180 tablet, Rfl: 1   lisinopril  (ZESTRIL ) 20 MG tablet, Take 1 tablet (20 mg total) by mouth daily., Disp: 90 tablet, Rfl: 1   loratadine  (CLARITIN ) 10 MG tablet, Take 1 tablet (10 mg total) by mouth daily., Disp: 90 tablet, Rfl: 1   metFORMIN  (GLUCOPHAGE ) 500 MG tablet, Take 1 tablet (500 mg total) by mouth daily with breakfast., Disp: 90 tablet, Rfl: 1   rosuvastatin  (CRESTOR ) 40 MG tablet, Take 1 tablet (40 mg  total) by mouth daily., Disp: 90 tablet, Rfl: 1   triamcinolone  cream (KENALOG ) 0.1 %, Apply 1 Application topically 2 (two) times daily., Disp: 453.6 g, Rfl: 1   VASCEPA  1 g capsule, Take 2 capsules (2 g total) by mouth 2 (two) times daily., Disp: 360 capsule, Rfl: 1   Vitamin D , Ergocalciferol , (DRISDOL ) 1.25 MG (50000 UNIT) CAPS capsule, Take 1 capsule (50,000 Units total) by mouth every 7 (seven) days., Disp: 12 capsule, Rfl: 1  No Known Allergies  I personally reviewed active problem list, medication list, allergies with the patient/caregiver today.   ROS  Ten systems reviewed and is negative except as mentioned in HPI    Objective Physical Exam  CONSTITUTIONAL: Patient appears well-developed and well-nourished.  No distress. HEENT: Head atraumatic, normocephalic, neck supple. CARDIOVASCULAR: Normal rate, regular rhythm and normal heart sounds.  No murmur heard. No BLE edema. SKIN: eczema under control, hyperpigmentation from old rashes PULMONARY: Effort normal and breath sounds normal. No respiratory distress. ABDOMINAL: There is no tenderness or distention. MUSCULOSKELETAL: Normal gait. Without gross motor or sensory deficit. PSYCHIATRIC: Patient has a normal mood and affect. behavior is normal. Judgment and thought content normal.  Vitals:   11/09/23 1051  BP: 128/74  Pulse: 91  Resp: 16  SpO2: 99%  Weight: 248 lb 14.4 oz (112.9 kg)  Height: 5' 7 (1.702 m)    Body mass index is 38.98 kg/m.  Recent Results (from the past 2160 hours)  Lipid panel     Status: Abnormal   Collection Time: 09/24/23  9:04 AM  Result Value Ref Range   Cholesterol 228 (H) <200 mg/dL   HDL 39 (L) > OR = 40 mg/dL   Triglycerides 830 (H) <150 mg/dL   LDL Cholesterol (Calc) 158 (H) mg/dL (calc)    Comment: Reference range: <100 . Desirable range <100 mg/dL for primary prevention;   <70 mg/dL for patients with CHD or diabetic patients  with > or = 2 CHD risk factors. SABRA LDL-C is now  calculated using the Martin-Hopkins  calculation, which is a validated novel method providing  better accuracy than the Friedewald equation in the  estimation of LDL-C.  Gladis APPLETHWAITE et al. SANDREA. 7986;689(80): 2061-2068  (http://education.[facility].com/faq/FAQ164)    Total CHOL/HDL Ratio 5.8 (H) <5.0 (calc)   Non-HDL Cholesterol (Calc) 189 (H) <130 mg/dL (calc)    Comment: For patients with diabetes plus 1 major ASCVD risk  factor, treating to a non-HDL-C goal of <100 mg/dL  (LDL-C of <29 mg/dL) is considered a therapeutic  option.   Hemoglobin A1c     Status: Abnormal   Collection Time: 09/24/23  9:04 AM  Result Value Ref Range   Hgb A1c  MFr Bld 6.5 (H) <5.7 %    Comment: For someone without known diabetes, a hemoglobin A1c value of 6.5% or greater indicates that they may have  diabetes and this should be confirmed with a follow-up  test. . For someone with known diabetes, a value <7% indicates  that their diabetes is well controlled and a value  greater than or equal to 7% indicates suboptimal  control. A1c targets should be individualized based on  duration of diabetes, age, comorbid conditions, and  other considerations. . Currently, no consensus exists regarding use of hemoglobin A1c for diagnosis of diabetes for children. .    Mean Plasma Glucose 140 mg/dL   eAG (mmol/L) 7.7 mmol/L  VITAMIN D  25 Hydroxy (Vit-D Deficiency, Fractures)     Status: Abnormal   Collection Time: 09/24/23  9:04 AM  Result Value Ref Range   Vit D, 25-Hydroxy 22 (L) 30 - 100 ng/mL    Comment: Vitamin D  Status         25-OH Vitamin D : . Deficiency:                    <20 ng/mL Insufficiency:             20 - 29 ng/mL Optimal:                 > or = 30 ng/mL . For 25-OH Vitamin D  testing on patients on  D2-supplementation and patients for whom quantitation  of D2 and D3 fractions is required, the [facility]ure[facility](TM) 25-OH VIT D, (D2,D3), LC/MS/MS is recommended: order  code 07111  (patients >44yrs). . See Note 1 . Note 1 . For additional information, please refer to  http://education.[facility].com/faq/FAQ199  (This link is being provided for informational/ educational purposes only.)   CBC with Differential/Platelet     Status: None   Collection Time: 09/24/23  9:04 AM  Result Value Ref Range   WBC 6.7 3.8 - 10.8 Thousand/uL   RBC 5.44 4.20 - 5.80 Million/uL   Hemoglobin 15.5 13.2 - 17.1 g/dL   HCT 51.4 61.4 - 49.9 %   MCV 89.2 80.0 - 100.0 fL   MCH 28.5 27.0 - 33.0 pg   MCHC 32.0 32.0 - 36.0 g/dL    Comment: For adults, a slight decrease in the calculated MCHC value (in the range of 30 to 32 g/dL) is most likely not clinically significant; however, it should be interpreted with caution in correlation with other red cell parameters and the patient's clinical condition.    RDW 14.5 11.0 - 15.0 %   Platelets 192 140 - 400 Thousand/uL   MPV 12.0 7.5 - 12.5 fL   Neutro Abs 3,906 1,500 - 7,800 cells/uL   Absolute Lymphocytes 1,635 850 - 3,900 cells/uL   Absolute Monocytes 811 200 - 950 cells/uL   Eosinophils Absolute 302 15 - 500 cells/uL   Basophils Absolute 47 0 - 200 cells/uL   Neutrophils Relative % 58.3 %   Total Lymphocyte 24.4 %   Monocytes Relative 12.1 %   Eosinophils Relative 4.5 %   Basophils Relative 0.7 %  PSA     Status: None   Collection Time: 09/24/23  9:04 AM  Result Value Ref Range   PSA 1.47 < OR = 4.00 ng/mL    Comment: The total PSA value from this assay system is  standardized against the WHO standard. The test  result will be approximately 20% lower when compared  to the equimolar-standardized total PSA [facility]  [place]). Comparison of serial PSA results should be  interpreted with this fact in mind. . This test was performed using the Siemens  chemiluminescent method. Values obtained from  different assay methods cannot be used interchangeably. PSA levels, regardless of value, should not be interpreted as  absolute evidence of the presence or absence of disease.     Diabetic Foot Exam:     PHQ2/9:    11/09/2023   10:46 AM 09/24/2023    7:58 AM 06/30/2023    9:04 AM 03/31/2023    9:49 AM 12/10/2022    8:07 AM  Depression screen PHQ 2/9  Decreased Interest 0 0 0 0 0  Down, Depressed, Hopeless 0 0 0 0 0  PHQ - 2 Score 0 0 0 0 0  Altered sleeping    0 0  Tired, decreased energy    0 0  Change in appetite    0 0  Feeling bad or failure about yourself     0 0  Trouble concentrating    0 0  Moving slowly or fidgety/restless    0 0  Suicidal thoughts    0 0  PHQ-9 Score    0 0  Difficult doing work/chores    Not difficult at all     phq 9 is negative  Fall Risk:    11/09/2023   10:46 AM 09/24/2023    7:58 AM 06/30/2023    9:01 AM 12/10/2022    8:07 AM 09/23/2022    7:39 AM  Fall Risk   Falls in the past year? 0 0 0 0 0  Number falls in past yr: 0 0 0 0 0  Injury with Fall? 0 0 0 0 0  Risk for fall due to : No Fall Risks No Fall Risks No Fall Risks No Fall Risks No Fall Risks  Follow up Falls evaluation completed Falls evaluation completed Falls prevention discussed;Education provided;Falls evaluation completed  Falls prevention discussed     Assessment & Plan Chronic right knee pain Chronic right knee pain likely due to osteoarthritis, exacerbated by working on concrete floors and obesity. NSAIDs contraindicated due to proteinuria and hypertension. - Advise Tylenol 500 mg four times daily for pain management. - Recommend Voltaren gel for topical pain relief. - Order knee X-ray to evaluate for osteoarthritis. - Instruct to report any swelling or increased pain.  Morbid obesity Morbid obesity with BMI of 38.98, with HTN , improved from previous levels. Weight loss important to alleviate knee pain and reduce cardiovascular risk. - Encourage continued physical activity and dietary modifications. - Advise to maintain BMI below 35 to reduce health risks.  Familial  hypercholesterolemia Familial hypercholesterolemia with LDL cholesterol at 158 mg/dL despite rosuvastatin  40 mg daily. Recently started on Repatha , necessary to prevent cardiovascular events. - Continue rosuvastatin  40 mg daily. - Continue Repatha  as prescribed. - Monitor cholesterol levels at next visit.  Hypertension Hypertension well-controlled with lisinopril  20 mg daily. Blood pressure today is 128/74 mmHg. - Continue lisinopril  20 mg daily.  Prediabetes Prediabetes with A1c of 6.5%, in the diabetes range. Managed with metformin . Decision to wait for next A1c result before diagnosing diabetes. - Continue metformin  as prescribed. - Monitor A1c and fasting glucose at next visit.  Obstructive sleep apnea Obstructive sleep apnea managed with CPAP. Reports feeling rested with use. - Encourage continued use of CPAP for at least four hours nightly.  Allergic dermatitis Allergic dermatitis well-controlled with hydroxyzine  and triamcinolone . - Continue hydroxyzine  and triamcinolone  as needed.

## 2023-11-29 ENCOUNTER — Other Ambulatory Visit (HOSPITAL_COMMUNITY): Payer: Self-pay

## 2023-11-30 ENCOUNTER — Telehealth: Payer: Self-pay | Admitting: Pharmacy Technician

## 2023-11-30 ENCOUNTER — Other Ambulatory Visit (HOSPITAL_COMMUNITY): Payer: Self-pay

## 2023-11-30 NOTE — Telephone Encounter (Signed)
 Pharmacy Patient Advocate Encounter   Received notification from CoverMyMeds that prior authorization for Vascepa  1GM capsule is due for renewal.   Insurance verification completed.   The patient is insured through [facility].  Action: Medication is now available without a prior authorization.  I also called [facility] and spoke with rep. Vicky and she confirmed PA is not needed

## 2023-12-25 ENCOUNTER — Other Ambulatory Visit: Payer: Self-pay | Admitting: Family Medicine

## 2023-12-25 DIAGNOSIS — E7849 Other hyperlipidemia: Secondary | ICD-10-CM

## 2023-12-27 NOTE — Telephone Encounter (Signed)
 Requested Prescriptions  Refused Prescriptions Disp Refills   ezetimibe  (ZETIA ) 10 MG tablet [Pharmacy Med Name: EZETIMIBE  10MG  TABLETS] 90 tablet 2    Sig: TAKE 1 TABLET BY MOUTH DAILY WITH ROSUVASTATIN      Cardiovascular:  Antilipid - Sterol Transport Inhibitors Failed - 12/27/2023  2:47 PM      Failed - Lipid Panel in normal range within the last 12 months    Cholesterol, Total  Date Value Ref Range Status  05/23/2015 194 100 - 199 mg/dL Final   Cholesterol  Date Value Ref Range Status  09/24/2023 228 (H) <200 mg/dL Final   LDL Cholesterol (Calc)  Date Value Ref Range Status  09/24/2023 158 (H) mg/dL (calc) Final    Comment:    Reference range: <100 . Desirable range <100 mg/dL for primary prevention;   <70 mg/dL for patients with CHD or diabetic patients  with > or = 2 CHD risk factors. SABRA LDL-C is now calculated using the Martin-Hopkins  calculation, which is a validated novel method providing  better accuracy than the Friedewald equation in the  estimation of LDL-C.  Gladis APPLETHWAITE et al. SANDREA. 7986;689(80): 2061-2068  (http://education.[employer].com/faq/FAQ164)    HDL  Date Value Ref Range Status  09/24/2023 39 (L) > OR = 40 mg/dL Final  95/79/7982 31 (L) >39 mg/dL Final   Triglycerides  Date Value Ref Range Status  09/24/2023 169 (H) <150 mg/dL Final         Passed - AST in normal range and within 360 days    AST  Date Value Ref Range Status  03/31/2023 16 10 - 35 U/L Final         Passed - ALT in normal range and within 360 days    ALT  Date Value Ref Range Status  03/31/2023 12 9 - 46 U/L Final         Passed - Patient is not pregnant      Passed - Valid encounter within last 12 months    Recent Outpatient Visits           1 month ago Chronic pain of right knee   [facility] Glenard Mire, MD   3 months ago Well adult exam   [facility] Glenard Mire, MD   6 months ago Benign  essential HTN   [facility] Glenard Mire, MD   9 months ago Benign essential HTN   [facility] Sowles, Krichna, MD       Future Appointments             In 1 week Sowles, Krichna, MD [facility] [place]

## 2024-01-03 ENCOUNTER — Ambulatory Visit: Admitting: Family Medicine

## 2024-01-03 ENCOUNTER — Encounter: Payer: Self-pay | Admitting: Family Medicine

## 2024-01-03 VITALS — BP 130/80 | HR 82 | Resp 16 | Ht 67.0 in | Wt 249.5 lb

## 2024-01-03 DIAGNOSIS — E7849 Other hyperlipidemia: Secondary | ICD-10-CM

## 2024-01-03 DIAGNOSIS — R809 Proteinuria, unspecified: Secondary | ICD-10-CM | POA: Diagnosis not present

## 2024-01-03 DIAGNOSIS — G4733 Obstructive sleep apnea (adult) (pediatric): Secondary | ICD-10-CM

## 2024-01-03 DIAGNOSIS — E8881 Metabolic syndrome: Secondary | ICD-10-CM

## 2024-01-03 DIAGNOSIS — I10 Essential (primary) hypertension: Secondary | ICD-10-CM | POA: Diagnosis not present

## 2024-01-03 DIAGNOSIS — E559 Vitamin D deficiency, unspecified: Secondary | ICD-10-CM

## 2024-01-03 DIAGNOSIS — L239 Allergic contact dermatitis, unspecified cause: Secondary | ICD-10-CM

## 2024-01-03 LAB — POCT GLYCOSYLATED HEMOGLOBIN (HGB A1C): Hemoglobin A1C: 5.9 % — AB (ref 4.0–5.6)

## 2024-01-03 MED ORDER — LISINOPRIL 20 MG PO TABS: 20.0000 mg | ORAL_TABLET | Freq: Every day | ORAL | 1 refills | Status: AC

## 2024-01-03 MED ORDER — REPATHA 140 MG/ML ~~LOC~~ SOSY: 1.0000 | PREFILLED_SYRINGE | SUBCUTANEOUS | 5 refills | Status: AC

## 2024-01-03 MED ORDER — VASCEPA 1 G PO CAPS: 2.0000 g | ORAL_CAPSULE | Freq: Two times a day (BID) | ORAL | 1 refills | Status: AC

## 2024-01-03 MED ORDER — LORATADINE 10 MG PO TABS: 10.0000 mg | ORAL_TABLET | Freq: Every day | ORAL | 1 refills | Status: AC

## 2024-01-03 MED ORDER — METFORMIN HCL 500 MG PO TABS: 500.0000 mg | ORAL_TABLET | Freq: Every day | ORAL | 1 refills | Status: AC

## 2024-01-03 MED ORDER — HYDROXYZINE HCL 25 MG PO TABS: 25.0000 mg | ORAL_TABLET | Freq: Two times a day (BID) | ORAL | 1 refills | Status: AC | PRN

## 2024-01-03 NOTE — Progress Notes (Signed)
 Name: Isaac Delacruz   MRN: [id number]    DOB: Jul 29, 1965   Date:01/03/2024       Progress Note  Subjective  Chief Complaint  Chief Complaint  Patient presents with   Medical Management of Chronic Issues   Discussed the use of AI scribe software for clinical note transcription with the patient, who gave verbal consent to proceed.  History of Present Illness Isaac Delacruz is a 58 year old male with prediabetes, hypertension, and dyslipidemia who presents for a follow-up visit.  His A1c level has improved from 6.5% in August to 5.9%. He reports reducing soda intake from two to three cans a day to one can a day. He is currently taking metformin  once daily for prediabetes.  He is on Repatha , rosuvastatin , and Vascepa  for dyslipidemia. He started Repatha  about a month ago and reports no side effects. His cholesterol levels were previously high, with a total cholesterol of 158 mg/dL and HDL of 39 mg/dL. He has been consistent with his medication regimen and reports no muscle pains or myalgias.  He is taking lisinopril  20 mg daily for hypertension and reports no issues with this medication.  He obese with a BMI of 39.08. Despite reducing soda intake, his weight remains unchanged at 248 pounds since May. He wants to lose weight and is considering dietary adjustments, such as increasing protein intake.  He uses a CPAP machine nightly for sleep apnea and reports compliance with its use. He also takes vitamin D  supplements for a deficiency and has a prescription for hydroxyzine  and loratadine  for allergic dermatitis, which has improved significantly.  He experiences dry eyes, particularly in cold weather, and uses eye drops as recommended by his eye doctor.    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   Enlarged lymph node 09/22/2018   Seasonal allergic rhinitis 08/18/2016   Folliculitis 05/23/2015   Allergic  dermatitis 05/23/2015   History of Bell's palsy 07/18/2014   Benign essential HTN 07/18/2014   Dyslipidemia 07/18/2014   Low back pain 07/18/2014   Vitamin D  deficiency 07/18/2014   Dysmetabolic syndrome 07/18/2014   Microalbuminuria 07/18/2014   Morbid obesity (HCC) 07/18/2014   OSA on CPAP 07/18/2014   Tobacco use 07/18/2014    Past Surgical History:  Procedure Laterality Date   COLONOSCOPY     COLONOSCOPY WITH PROPOFOL  N/A 11/11/2016   Procedure: COLONOSCOPY WITH PROPOFOL ;  Surgeon: Unk Corinn Skiff, MD;  [facility];  Service: Endoscopy;  Laterality: N/A;  sleep apnea    Family History  Problem Relation Age of Onset   Cancer Mother 25       Lung   Cancer Father        Prostate   Diabetes Father    Hypertension Brother    Cancer Maternal Aunt        Lung   Heart disease Brother     Social History   Tobacco Use   Smoking status: Some Days    Types: Cigars    Start date: 10/12/1997   Smokeless tobacco: Never   Tobacco comments:    Smokes 2 cigars daily  Substance Use Topics   Alcohol use: Not Currently    Comment: rarely     Current Outpatient Medications:    aspirin 81 MG tablet, Take by mouth., Disp: , Rfl:    Evolocumab  (REPATHA ) 140 MG/ML SOSY, Inject 140 mg into the skin every  14 (fourteen) days., Disp: 2.1 mL, Rfl: 5   hydrOXYzine  (ATARAX ) 25 MG tablet, Take 1 tablet (25 mg total) by mouth 2 (two) times daily as needed., Disp: 180 tablet, Rfl: 1   lisinopril  (ZESTRIL ) 20 MG tablet, Take 1 tablet (20 mg total) by mouth daily., Disp: 90 tablet, Rfl: 1   loratadine  (CLARITIN ) 10 MG tablet, Take 1 tablet (10 mg total) by mouth daily., Disp: 90 tablet, Rfl: 1   metFORMIN  (GLUCOPHAGE ) 500 MG tablet, Take 1 tablet (500 mg total) by mouth daily with breakfast., Disp: 90 tablet, Rfl: 1   rosuvastatin  (CRESTOR ) 40 MG tablet, Take 1 tablet (40 mg total) by mouth daily., Disp: 90 tablet, Rfl: 1   triamcinolone  cream (KENALOG ) 0.1 %, Apply 1  Application topically 2 (two) times daily., Disp: 453.6 g, Rfl: 1   VASCEPA  1 g capsule, Take 2 capsules (2 g total) by mouth 2 (two) times daily., Disp: 360 capsule, Rfl: 1   Vitamin D , Ergocalciferol , (DRISDOL ) 1.25 MG (50000 UNIT) CAPS capsule, Take 1 capsule (50,000 Units total) by mouth every 7 (seven) days., Disp: 12 capsule, Rfl: 1  No Known Allergies  I personally reviewed active problem list, medication list, allergies, family history with the patient/caregiver today.   ROS  Ten systems reviewed and is negative except as mentioned in HPI    Objective Physical Exam  CONSTITUTIONAL: Patient appears well-developed and well-nourished.  No distress. HEENT: Head atraumatic, normocephalic, neck supple. CARDIOVASCULAR: Normal rate, regular rhythm and normal heart sounds.  No murmur heard. No BLE edema. PULMONARY: Effort normal and breath sounds normal. No respiratory distress. ABDOMINAL: There is no tenderness or distention. MUSCULOSKELETAL: Normal gait. Without gross motor or sensory deficit. PSYCHIATRIC: Patient has a normal mood and affect. behavior is normal. Judgment and thought content normal.  Vitals:   01/03/24 0822  BP: 130/80  Pulse: 82  Resp: 16  SpO2: 97%  Weight: 249 lb 8 oz (113.2 kg)  Height: 5' 7 (1.702 m)    Body mass index is 39.08 kg/m.  Recent Results (from the past 2160 hours)  POCT glycosylated hemoglobin (Hb A1C)     Status: Abnormal   Collection Time: 01/03/24  8:25 AM  Result Value Ref Range   Hemoglobin A1C 5.9 (A) 4.0 - 5.6 %   HbA1c POC (<> result, manual entry)     HbA1c, POC (prediabetic range)     HbA1c, POC (controlled diabetic range)       PHQ2/9:    01/03/2024    8:14 AM 11/09/2023   10:46 AM 09/24/2023    7:58 AM 06/30/2023    9:04 AM 03/31/2023    9:49 AM  Depression screen PHQ 2/9  Decreased Interest 0 0 0 0 0  Down, Depressed, Hopeless 0 0 0 0 0  PHQ - 2 Score 0 0 0 0 0  Altered sleeping     0  Tired, decreased energy      0  Change in appetite     0  Feeling bad or failure about yourself      0  Trouble concentrating     0  Moving slowly or fidgety/restless     0  Suicidal thoughts     0  PHQ-9 Score     0   Difficult doing work/chores     Not difficult at all     Data saved with a previous flowsheet row definition    phq 9 is negative  Fall Risk:  01/03/2024    8:14 AM 11/09/2023   10:46 AM 09/24/2023    7:58 AM 06/30/2023    9:01 AM 12/10/2022    8:07 AM  Fall Risk   Falls in the past year? 0 0 0 0 0  Number falls in past yr: 0 0 0 0 0  Injury with Fall? 0 0 0 0 0  Risk for fall due to : No Fall Risks No Fall Risks No Fall Risks No Fall Risks No Fall Risks  Follow up Falls evaluation completed Falls evaluation completed Falls evaluation completed Falls prevention discussed;Education provided;Falls evaluation completed      Assessment & Plan Morbid obesity  BMI 39.8, no weight loss. Comorbidities: hyperlipidemia, hypertension. - Encouraged increased protein intake to 200g/day. - Discussed weight loss medication if diabetes develops.  Essential hypertension BP 130/80 mmHg, not hypertensive. - Continue lisinopril  20 mg daily.  Prediabetes A1c decreased to 5.9%, improvement noted. - Continue metformin  once daily. - Reduce soda intake to one can per day.  Mixed hyperlipidemia Improved cholesterol levels. LDL 158 mg/dL, HDL 39 mg/dL. - Continue rosuvastatin . - Continue Vascepa , two capsules morning and night. - Continue Repatha  every 14 days. - Discontinued Zetia .  Obstructive sleep apnea Compliant with CPAP therapy. - Continue CPAP therapy.  Allergic contact dermatitis Exacerbated by dry weather, hydroxyzine  effective. - Continue hydroxyzine  for itching. - Use lotion for dryness.  Vitamin D  deficiency Taking prescription vitamin D . - Continue prescription vitamin D .  Dry eye syndrome Dry eyes in cold weather, using eye drops and glasses. - Continue using eye drops as  needed. - Wear glasses in cold weather.  General Health Maintenance - Continue routine health maintenance.

## 2024-02-17 ENCOUNTER — Ambulatory Visit: Admitting: Family Medicine

## 2024-07-04 ENCOUNTER — Ambulatory Visit: Admitting: Family Medicine
